# Patient Record
Sex: Male | Born: 1940 | Race: Black or African American | Hispanic: No | Marital: Married | Smoking: Former smoker
Health system: Southern US, Community
[De-identification: ages and names within clinical notes are randomized; demographics above are authoritative.]

## PROBLEM LIST (undated history)

## (undated) DIAGNOSIS — M4850XA Collapsed vertebra, not elsewhere classified, site unspecified, initial encounter for fracture: Secondary | ICD-10-CM

## (undated) DIAGNOSIS — E785 Hyperlipidemia, unspecified: Secondary | ICD-10-CM

## (undated) DIAGNOSIS — I10 Essential (primary) hypertension: Secondary | ICD-10-CM

## (undated) HISTORY — PX: VASCULAR SURGERY: SHX849

## (undated) HISTORY — PX: HERNIA REPAIR: SHX51

## (undated) HISTORY — PX: CATARACT EXTRACTION: SUR2

---

## 2013-11-10 DIAGNOSIS — N3289 Other specified disorders of bladder: Secondary | ICD-10-CM | POA: Insufficient documentation

## 2016-08-17 DIAGNOSIS — M4850XA Collapsed vertebra, not elsewhere classified, site unspecified, initial encounter for fracture: Secondary | ICD-10-CM

## 2016-08-17 HISTORY — DX: Collapsed vertebra, not elsewhere classified, site unspecified, initial encounter for fracture: M48.50XA

## 2016-09-03 DIAGNOSIS — M4856XA Collapsed vertebra, not elsewhere classified, lumbar region, initial encounter for fracture: Secondary | ICD-10-CM | POA: Insufficient documentation

## 2016-09-15 ENCOUNTER — Inpatient Hospital Stay (HOSPITAL_COMMUNITY)
Admission: AD | Admit: 2016-09-15 | Discharge: 2016-10-02 | DRG: 477 | Disposition: A | Discharge status: Hospice | Payer: Medicare Other | Source: Ambulatory Visit | Attending: Neurosurgery | Admitting: Neurosurgery

## 2016-09-15 ENCOUNTER — Other Ambulatory Visit: Payer: Self-pay | Admitting: Neurosurgery

## 2016-09-15 ENCOUNTER — Encounter (HOSPITAL_COMMUNITY): Payer: Self-pay | Admitting: General Practice

## 2016-09-15 ENCOUNTER — Inpatient Hospital Stay (HOSPITAL_COMMUNITY): Payer: Medicare Other

## 2016-09-15 DIAGNOSIS — G911 Obstructive hydrocephalus: Secondary | ICD-10-CM | POA: Diagnosis not present

## 2016-09-15 DIAGNOSIS — Z515 Encounter for palliative care: Secondary | ICD-10-CM | POA: Diagnosis not present

## 2016-09-15 DIAGNOSIS — D496 Neoplasm of unspecified behavior of brain: Secondary | ICD-10-CM

## 2016-09-15 DIAGNOSIS — Z8551 Personal history of malignant neoplasm of bladder: Secondary | ICD-10-CM

## 2016-09-15 DIAGNOSIS — R410 Disorientation, unspecified: Secondary | ICD-10-CM | POA: Diagnosis not present

## 2016-09-15 DIAGNOSIS — C3491 Malignant neoplasm of unspecified part of right bronchus or lung: Secondary | ICD-10-CM | POA: Diagnosis not present

## 2016-09-15 DIAGNOSIS — Z87891 Personal history of nicotine dependence: Secondary | ICD-10-CM

## 2016-09-15 DIAGNOSIS — Z9889 Other specified postprocedural states: Secondary | ICD-10-CM

## 2016-09-15 DIAGNOSIS — Z993 Dependence on wheelchair: Secondary | ICD-10-CM | POA: Diagnosis not present

## 2016-09-15 DIAGNOSIS — G939 Disorder of brain, unspecified: Secondary | ICD-10-CM

## 2016-09-15 DIAGNOSIS — N182 Chronic kidney disease, stage 2 (mild): Secondary | ICD-10-CM | POA: Diagnosis not present

## 2016-09-15 DIAGNOSIS — M4850XA Collapsed vertebra, not elsewhere classified, site unspecified, initial encounter for fracture: Secondary | ICD-10-CM

## 2016-09-15 DIAGNOSIS — E43 Unspecified severe protein-calorie malnutrition: Secondary | ICD-10-CM | POA: Diagnosis present

## 2016-09-15 DIAGNOSIS — C3490 Malignant neoplasm of unspecified part of unspecified bronchus or lung: Secondary | ICD-10-CM | POA: Diagnosis not present

## 2016-09-15 DIAGNOSIS — N189 Chronic kidney disease, unspecified: Secondary | ICD-10-CM | POA: Insufficient documentation

## 2016-09-15 DIAGNOSIS — I739 Peripheral vascular disease, unspecified: Secondary | ICD-10-CM | POA: Diagnosis present

## 2016-09-15 DIAGNOSIS — R911 Solitary pulmonary nodule: Secondary | ICD-10-CM | POA: Diagnosis not present

## 2016-09-15 DIAGNOSIS — G936 Cerebral edema: Secondary | ICD-10-CM | POA: Diagnosis not present

## 2016-09-15 DIAGNOSIS — E785 Hyperlipidemia, unspecified: Secondary | ICD-10-CM | POA: Diagnosis present

## 2016-09-15 DIAGNOSIS — Z419 Encounter for procedure for purposes other than remedying health state, unspecified: Secondary | ICD-10-CM

## 2016-09-15 DIAGNOSIS — T380X5A Adverse effect of glucocorticoids and synthetic analogues, initial encounter: Secondary | ICD-10-CM | POA: Diagnosis present

## 2016-09-15 DIAGNOSIS — F0390 Unspecified dementia without behavioral disturbance: Secondary | ICD-10-CM | POA: Diagnosis present

## 2016-09-15 DIAGNOSIS — M8458XA Pathological fracture in neoplastic disease, other specified site, initial encounter for fracture: Principal | ICD-10-CM | POA: Diagnosis present

## 2016-09-15 DIAGNOSIS — M48061 Spinal stenosis, lumbar region without neurogenic claudication: Secondary | ICD-10-CM | POA: Diagnosis present

## 2016-09-15 DIAGNOSIS — M4850XS Collapsed vertebra, not elsewhere classified, site unspecified, sequela of fracture: Secondary | ICD-10-CM

## 2016-09-15 DIAGNOSIS — Z66 Do not resuscitate: Secondary | ICD-10-CM | POA: Diagnosis not present

## 2016-09-15 DIAGNOSIS — Z01810 Encounter for preprocedural cardiovascular examination: Secondary | ICD-10-CM | POA: Diagnosis not present

## 2016-09-15 DIAGNOSIS — C7931 Secondary malignant neoplasm of brain: Secondary | ICD-10-CM | POA: Diagnosis present

## 2016-09-15 DIAGNOSIS — E784 Other hyperlipidemia: Secondary | ICD-10-CM

## 2016-09-15 DIAGNOSIS — M5416 Radiculopathy, lumbar region: Secondary | ICD-10-CM | POA: Diagnosis present

## 2016-09-15 DIAGNOSIS — Y92230 Patient room in hospital as the place of occurrence of the external cause: Secondary | ICD-10-CM | POA: Diagnosis not present

## 2016-09-15 DIAGNOSIS — I10 Essential (primary) hypertension: Secondary | ICD-10-CM | POA: Diagnosis not present

## 2016-09-15 DIAGNOSIS — C787 Secondary malignant neoplasm of liver and intrahepatic bile duct: Secondary | ICD-10-CM | POA: Diagnosis not present

## 2016-09-15 DIAGNOSIS — N184 Chronic kidney disease, stage 4 (severe): Secondary | ICD-10-CM | POA: Diagnosis present

## 2016-09-15 DIAGNOSIS — I129 Hypertensive chronic kidney disease with stage 1 through stage 4 chronic kidney disease, or unspecified chronic kidney disease: Secondary | ICD-10-CM | POA: Diagnosis present

## 2016-09-15 DIAGNOSIS — R634 Abnormal weight loss: Secondary | ICD-10-CM

## 2016-09-15 DIAGNOSIS — M4850XD Collapsed vertebra, not elsewhere classified, site unspecified, subsequent encounter for fracture with routine healing: Secondary | ICD-10-CM | POA: Diagnosis not present

## 2016-09-15 DIAGNOSIS — Y836 Removal of other organ (partial) (total) as the cause of abnormal reaction of the patient, or of later complication, without mention of misadventure at the time of the procedure: Secondary | ICD-10-CM | POA: Diagnosis not present

## 2016-09-15 DIAGNOSIS — G9761 Postprocedural hematoma of a nervous system organ or structure following a nervous system procedure: Secondary | ICD-10-CM | POA: Diagnosis not present

## 2016-09-15 DIAGNOSIS — M4857XS Collapsed vertebra, not elsewhere classified, lumbosacral region, sequela of fracture: Secondary | ICD-10-CM | POA: Diagnosis not present

## 2016-09-15 DIAGNOSIS — N183 Chronic kidney disease, stage 3 (moderate): Secondary | ICD-10-CM | POA: Diagnosis not present

## 2016-09-15 DIAGNOSIS — R627 Adult failure to thrive: Secondary | ICD-10-CM

## 2016-09-15 DIAGNOSIS — G131 Other systemic atrophy primarily affecting central nervous system in neoplastic disease: Secondary | ICD-10-CM | POA: Diagnosis not present

## 2016-09-15 DIAGNOSIS — R739 Hyperglycemia, unspecified: Secondary | ICD-10-CM | POA: Diagnosis present

## 2016-09-15 DIAGNOSIS — M4856XA Collapsed vertebra, not elsewhere classified, lumbar region, initial encounter for fracture: Secondary | ICD-10-CM | POA: Diagnosis present

## 2016-09-15 DIAGNOSIS — G9341 Metabolic encephalopathy: Secondary | ICD-10-CM | POA: Diagnosis not present

## 2016-09-15 DIAGNOSIS — Z7189 Other specified counseling: Secondary | ICD-10-CM | POA: Diagnosis not present

## 2016-09-15 DIAGNOSIS — Y92019 Unspecified place in single-family (private) house as the place of occurrence of the external cause: Secondary | ICD-10-CM | POA: Diagnosis not present

## 2016-09-15 DIAGNOSIS — M899 Disorder of bone, unspecified: Secondary | ICD-10-CM | POA: Diagnosis not present

## 2016-09-15 DIAGNOSIS — C7951 Secondary malignant neoplasm of bone: Secondary | ICD-10-CM | POA: Diagnosis present

## 2016-09-15 HISTORY — DX: Hyperlipidemia, unspecified: E78.5

## 2016-09-15 HISTORY — DX: Collapsed vertebra, not elsewhere classified, site unspecified, initial encounter for fracture: M48.50XA

## 2016-09-15 HISTORY — DX: Essential (primary) hypertension: I10

## 2016-09-15 LAB — COMPREHENSIVE METABOLIC PANEL
ALK PHOS: 35 U/L — AB (ref 38–126)
ALT: 16 U/L — AB (ref 17–63)
AST: 26 U/L (ref 15–41)
Albumin: 2.9 g/dL — ABNORMAL LOW (ref 3.5–5.0)
Anion gap: 8 (ref 5–15)
BILIRUBIN TOTAL: 0.6 mg/dL (ref 0.3–1.2)
BUN: 17 mg/dL (ref 6–20)
CALCIUM: 9.2 mg/dL (ref 8.9–10.3)
CO2: 24 mmol/L (ref 22–32)
CREATININE: 1.57 mg/dL — AB (ref 0.61–1.24)
Chloride: 105 mmol/L (ref 101–111)
GFR, EST AFRICAN AMERICAN: 47 mL/min — AB (ref >60)
GFR, EST NON AFRICAN AMERICAN: 41 mL/min — AB (ref >60)
Glucose, Bld: 114 mg/dL — ABNORMAL HIGH (ref 65–99)
Potassium: 4 mmol/L (ref 3.5–5.1)
Sodium: 137 mmol/L (ref 135–145)
TOTAL PROTEIN: 6.4 g/dL — AB (ref 6.5–8.1)

## 2016-09-15 LAB — CBC
HCT: 36.4 % — ABNORMAL LOW (ref 39.0–52.0)
Hemoglobin: 11.8 g/dL — ABNORMAL LOW (ref 13.0–17.0)
MCH: 26.8 pg (ref 26.0–34.0)
MCHC: 32.4 g/dL (ref 30.0–36.0)
MCV: 82.7 fL (ref 78.0–100.0)
PLATELETS: 134 10*3/uL — AB (ref 150–400)
RBC: 4.4 MIL/uL (ref 4.22–5.81)
RDW: 15.1 % (ref 11.5–15.5)
WBC: 4 10*3/uL (ref 4.0–10.5)

## 2016-09-15 LAB — GLUCOSE, CAPILLARY
GLUCOSE-CAPILLARY: 94 mg/dL (ref 65–99)
Glucose-Capillary: 106 mg/dL — ABNORMAL HIGH (ref 65–99)

## 2016-09-15 MED ORDER — AMLODIPINE BESYLATE 10 MG PO TABS
10.0000 mg | ORAL_TABLET | Freq: Every day | ORAL | Status: DC
  Administered 2016-09-15 – 2016-09-28 (×14): 10 mg via ORAL
  Filled 2016-09-15 (×14): qty 1

## 2016-09-15 MED ORDER — CARVEDILOL 12.5 MG PO TABS
25.0000 mg | ORAL_TABLET | Freq: Two times a day (BID) | ORAL | Status: DC
  Administered 2016-09-15 – 2016-09-28 (×26): 25 mg via ORAL
  Filled 2016-09-15 (×26): qty 1

## 2016-09-15 MED ORDER — HYDROCODONE-ACETAMINOPHEN 5-325 MG PO TABS
1.0000 | ORAL_TABLET | ORAL | Status: DC | PRN
  Filled 2016-09-15: qty 2
  Filled 2016-09-15: qty 1

## 2016-09-15 MED ORDER — EZETIMIBE 10 MG PO TABS
10.0000 mg | ORAL_TABLET | Freq: Every day | ORAL | Status: DC
  Administered 2016-09-15 – 2016-09-28 (×12): 10 mg via ORAL
  Filled 2016-09-15 (×12): qty 1

## 2016-09-15 MED ORDER — LACTATED RINGERS IV SOLN
INTRAVENOUS | Status: DC
  Administered 2016-09-15 – 2016-09-16 (×3): via INTRAVENOUS

## 2016-09-15 MED ORDER — SIMVASTATIN 20 MG PO TABS
20.0000 mg | ORAL_TABLET | Freq: Every day | ORAL | Status: DC
  Administered 2016-09-15 – 2016-09-28 (×13): 20 mg via ORAL
  Filled 2016-09-15 (×12): qty 1

## 2016-09-15 MED ORDER — MORPHINE SULFATE (PF) 4 MG/ML IV SOLN
1.0000 mg | INTRAVENOUS | Status: DC | PRN
  Administered 2016-09-15 – 2016-09-30 (×11): 2 mg via INTRAVENOUS
  Filled 2016-09-15 (×11): qty 1

## 2016-09-15 MED ORDER — HEPARIN SODIUM (PORCINE) 5000 UNIT/ML IJ SOLN
5000.0000 [IU] | Freq: Three times a day (TID) | INTRAMUSCULAR | Status: DC
  Administered 2016-09-15 – 2016-09-17 (×3): 5000 [IU] via SUBCUTANEOUS
  Filled 2016-09-15 (×3): qty 1

## 2016-09-15 MED ORDER — HYDRALAZINE HCL 20 MG/ML IJ SOLN
5.0000 mg | INTRAMUSCULAR | Status: DC | PRN
  Administered 2016-09-29 (×2): 10 mg via INTRAVENOUS
  Filled 2016-09-15 (×3): qty 1

## 2016-09-15 NOTE — Consult Note (Signed)
Medical Consultation   Zachary Mccarthy  LDJ:[id number]  DOB: [birth year]  DOA: 09/15/2016  PCP: [name], MD    Requesting physician: Dr. Kathyrn Sheriff Neurosurgery  Reason for consultation: To help in the management of patient's primary medical issues     History of Present Illness: Zachary Mccarthy is an 76 y.o. male with a history of hypertension, hyperlipidemia, CKD  renal mass, brought via direct admission from the office of Dr. Kathyrn Sheriff, Neurosurgery, after patient presented with severe right sided leg pain, progressive, present over the last 5 months, radiating to right thigh and to the right knee, limiting his ADLS. A prior MRI taken on 4/12 as outpatient showed pathologic L4 compression fracture with greater than 50 % loss of height. He is to undergo R L4-5 decompression with biopsy of the L4 VB tomorrow afternoon. [facility] team has been requested to help in the patient's medical management. He denies any bowel or bladder incontinence or retention, No Left lower extremity symptoms., No history of cancer. Has lost about 20 lbs over the last 5 months, unintentional. Denies fevers, chills, night sweats.  Denies any respiratory complaints. Denies any chest pain or palpitations. Denies lower extremity swelling. Denies nausea, heartburn or  abdominal pain.  Denies abnormal skin rashes Denies any bleeding issues such as epistaxis, hematemesis, hematuria or hematochezia. Family  reports confused when taking Norco for which he has not been using narcotic pain meds. Denies any headaches, vision changes or seizure activity, falls or syncope. Family is not aware of patient having been diagnosed with dementia.     Review of Systems:  As per HPI otherwise 10 point review of systems negative.     Past Medical History: Past Medical History:  Diagnosis Date  . Compression fracture of spine ([place]) 08/2016  . Hyperlipidemia   . Hypertension     Past Surgical History: Past  Surgical History:  Procedure Laterality Date  . CATARACT EXTRACTION    . HERNIA REPAIR    . VASCULAR SURGERY       Allergies:  No Known Allergies   Social History: Social History   Social History  . Marital status: Married    Spouse name: N/A  . Number of children: N/A  . Years of education: N/A   Occupational History  . Not on file.   Social History Main Topics  . Smoking status: Former Research scientist (life sciences)  . Smokeless tobacco: Never Used     Comment: quit smoking in the 80's   . Alcohol use No  . Drug use: No  . Sexual activity: Not on file   Other Topics Concern  . Not on file   Social History Narrative  . No narrative on file       Family History: Family History  Problem Relation Age of Onset  . Family history unknown: Yes    Family history reviewed and not pertinent    Physical Exam: Vitals:   09/15/16 1306 09/15/16 1530 09/15/16 1725  BP: (!) 176/96 (!) 152/76 (!) 153/75  Pulse: 83  79  Resp: '18 18 18  '$ Temp: 97.9 F (36.6 C) 97.8 F (36.6 C) 98.6 F (37 C)  TempSrc: Oral Oral Oral  SpO2: 99% 99% 97%  Weight: 63.2 kg (139 lb 6.4 oz)    Height: '5\' 9"'$  (1.753 m)      Constitutional: Appears calm,  alert and awake, conversant. Knows place and date, person at the  time of exam. NAD Eyes: PERLA, EOMI, irises appear normal, anicteric sclera,  ENMT: external ears and nose appear normal, normal hearing or hard of hearing.  Lips appears normal, oropharynx mucosa, tongue, posterior pharynx appear normal  Neck: neck appears normal, no masses, normal ROM, no thyromegaly, no JVD  CVS RRR with some PVC  no murmur rubs or gallops, no LE edema, normal pedal pulses  Respiratory: clear to auscultation bilaterally, no wheezing, rales or rhonchi. Respiratory effort normal. No accessory muscle use.  Abdomen: soft nontender, nondistended, normal bowel sounds, no hepatosplenomegaly, no hernias  Musculoskeletal: no cyanosis, clubbing or edema noted bilaterally.        Neuro:Normal sensation, decreased 3/5 strength on R  Psych:   stable mood and affect, mental status appears normal at this time Skin: no rashes or lesions or ulcers, no induration or nodules   Data reviewed:  I have personally reviewed following labs and imaging studies Labs:  CBC:  Recent Labs Lab 09/15/16 1507  WBC 4.0  HGB 11.8*  HCT 36.4*  MCV 82.7  PLT 134*    Basic Metabolic Panel:  Recent Labs Lab 09/15/16 1507  NA 137  K 4.0  CL 105  CO2 24  GLUCOSE 114*  BUN 17  CREATININE 1.57*  CALCIUM 9.2   GFR Estimated Creatinine Clearance: 35.2 mL/min (A) (by C-G formula based on SCr of 1.57 mg/dL (H)). Liver Function Tests:  Recent Labs Lab 09/15/16 1507  AST 26  ALT 16*  ALKPHOS 35*  BILITOT 0.6  PROT 6.4*  ALBUMIN 2.9*   No results for input(s): LIPASE, AMYLASE in the last 168 hours. No results for input(s): AMMONIA in the last 168 hours. Coagulation profile No results for input(s): INR, PROTIME in the last 168 hours.  Cardiac Enzymes: No results for input(s): CKTOTAL, CKMB, CKMBINDEX, TROPONINI in the last 168 hours. BNP: Invalid input(s): POCBNP CBG:  Recent Labs Lab 09/15/16 1747  GLUCAP 94   D-Dimer No results for input(s): DDIMER in the last 72 hours. Hgb A1c No results for input(s): HGBA1C in the last 72 hours. Lipid Profile No results for input(s): CHOL, HDL, LDLCALC, TRIG, CHOLHDL, LDLDIRECT in the last 72 hours. Thyroid function studies No results for input(s): TSH, T4TOTAL, T3FREE, THYROIDAB in the last 72 hours.  Invalid input(s): FREET3 Anemia work up No results for input(s): VITAMINB12, FOLATE, FERRITIN, TIBC, IRON, RETICCTPCT in the last 72 hours. Urinalysis No results found for: COLORURINE, APPEARANCEUR, LABSPEC, [place], GLUCOSEU, HGBUR, BILIRUBINUR, KETONESUR, PROTEINUR, UROBILINOGEN, NITRITE, LEUKOCYTESUR   Sepsis Labs Invalid input(s): PROCALCITONIN,  WBC,  LACTICIDVEN Microbiology No results found for this or any  previous visit (from the past 240 hour(s)).     Inpatient Medications:   Scheduled Meds: . amLODipine  10 mg Oral Daily  . carvedilol  25 mg Oral BID WC  . ezetimibe  10 mg Oral Daily  . heparin  5,000 Units Subcutaneous Q8H  . simvastatin  20 mg Oral q1800   Continuous Infusions: . lactated ringers       Radiological Exams on Admission: No results found.  Impression/Recommendations Active Problems:   Pathologic compression fracture of spine (HCC)   Essential hypertension   Hyperlipidemia   Chronic kidney disease (CKD), stage IV (severe) (HCC)   Pathologic compression Fracture, in a patient with a history of renal mass status post TURBT for high-grade noninvasive bladder cancer diagnosed in 2017 with negative cystoscopy on surveillance at [facility]. MRI taken on 4/12 as outpatient showed pathologic L4 compression fracture with  greater than 50 % loss of height. He is to undergo R L4-5 decompression with biopsy of the L4 VB tomorrow afternoon. Anticipating surgery as per Neurosurgery tomorrow afternoon NPO after midnight SCDs Lactate ringer 50 cc/h after midnight  Pain control as per admitting physician PT/OT consult Preop labs and CXR and EKG  Of note, patient may need Oncology consult while in hospital   Hypertension BP176/96   Pulse 83 Continue home anti-hypertensive medications    Hyperlipidemia Continue home statins   Chronic kidney disease stage  III  In the setting of renal cancer followed by Urology  baseline creatinine    Current Cr 1.57 baseline Lab Results  Component Value Date   CREATININE 1.57 (H) 09/15/2016  Repeat CMET in am  Decreased mentation, per family report, medicine induced such as narcotics, versus dementia, cannot rule out metastatic disease in view of pathologic compression fracture and history of renal cancer. Afebrile, labs unrevealing.  Consider holding Norco If dementia is suspected, patient will need outpatient follow up for its  treatment Consider CT head in a.m  if confusion is more evident r/o etiology including malignancy.   Hyperglycemia in the setting of recent steroids , no history of DM BS 115 CBG q AC and q HS A1C     Thank you for this consultation.  Our [facility] hospitalist team will follow the patient with you.      [facility] E PA-C [facility] 09/15/2016, 6:02 PM

## 2016-09-15 NOTE — H&P (Signed)
CC:  Right leg pain Lethargy Weight loss  HPI: Zachary Mccarthy is a 76 year old man I saw for initial consultation earlier today in the office.  He comes in today with a primary complaint of right-sided leg pain.  Symptoms started back in December about 5 months ago.  There is no particular inciting event, and he and his family deny any falls or accidents.  Initially, he had fairly severe back pain.  Over time, the back pain improved, but he began developing progressive right-sided leg pain.  He says the pain starts around the region of the right hip, with radiation down the back inside of his right thigh, to about the knee.  At this point, his family say that the pain prevents him from getting up, and he has been in a wheelchair for the last month or so.  His family states that they have a hard time getting him out of the house, and required the assistance of neighbors to get him into the car to bring him to the office today.  He has not noted any loss of bowel or bladder control.  He does not have any left leg symptoms.  There is no known history of cancer.  Upon further questioning, they do report several month history of generalized malaise, lethargy, and about 20 pounds of weight loss.  They state that he has not been eating well.    PMH: HTN  PSH: No past surgical history on file.  SH: Social History  Substance Use Topics  . Smoking status: Not on file  . Smokeless tobacco: Not on file  . Alcohol use Not on file  Non-smoker  MEDS: amlodipine 10 mg tablet take 1 tablet by oral route  every day  carvedilol 25 mg tablet take 1 tablet by oral route 2 times every day  clopidogrel 75 mg tablet take 1 tablet by oral route  every day  ezetimibe 10 mg-simvastatin 20 mg tablet take 1 tablet by oral route  every day    ALLERGY: NKDA  ROS: ROS  NEUROLOGIC EXAM: Awake, alert, oriented Memory and concentration grossly intact Speech fluent, appropriate CN grossly intact Motor exam: Upper  Extremities Deltoid Bicep Tricep Grip  Right 5/5 5/5 5/5 5/5  Left 5/5 5/5 5/5 5/5   Lower Extremity IP Quad PF DF EHL  Right 5/5 5/5 5/5 5/5 5/5  Left 5/5 5/5 5/5 5/5 5/5   Sensation grossly intact to LT  [facility]: MRI of the lumbar spine dated 08/28/2016 was reviewed.  This demonstrates primary pathology at L4, where there is compression fracture, with greater than 50 percent loss of height.  There does not appear to be any significant amount of retropulsed bone.  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  There does not appear to be any significant central or foraminal stenosis at the other levels.  IMPRESSION: 75 year old man with right-sided leg pain likely related to foraminal stenosis from the compression fracture.  Certainly, the concern is that this is a pathologic fracture.  He also does have several constitutional symptoms concerning for an underlying malignancy.  Functionally, he has essentially unable to leave the house, and his family is having a hard time taking care of him.  PLAN: - Right L4-5 decompression with biopsy of L4 vertebral body tomorrow afternoon - Will consult hospitalist service

## 2016-09-15 NOTE — Progress Notes (Signed)
Pt received direct admit from Md office. Pt alert, verbal with no noted distress. Pt with memory impairment his wife and daughter at bedside reported that after taking pain medication two weeks ago, he has been confused. He complaints of pain to right hip radiating to right knee. He rates pain 8/10. Wife and daughter were able to personal information. Pt oriented to room. Safety measures in place. Call bell within reach. No orders at this time. Will contact Md for further instructions.

## 2016-09-15 NOTE — Progress Notes (Signed)
MD office called and RN at the office notified of pt's arrival to the unit and pt needing orders from MD. Delia Heady RN

## 2016-09-15 NOTE — Care Management Note (Signed)
Case Management Note  Patient Details  Name: Zachary Mccarthy MRN: [id number] Date of Birth: 02/28/1941  Subjective/Objective:   Pt admitted with leg pain. He is from home with his spouse. Pt has been wheelchair bound for about a month.                 Action/Plan: Plan is for L4 vertebral biopsy tomorrow. CM following for d/c needs, physician orders.   Expected Discharge Date:                  Expected Discharge Plan:     In-House Referral:     Discharge planning Services     Post Acute Care Choice:    Choice offered to:     DME Arranged:    DME Agency:     HH Arranged:    HH Agency:     Status of Service:  In process, will continue to follow  If discussed at Long Length of Stay Meetings, dates discussed:    Additional Comments:  Pollie Friar, RN 09/15/2016, 3:17 PM

## 2016-09-16 ENCOUNTER — Inpatient Hospital Stay: Admit: 2016-09-16 | Payer: Medicare Other | Admitting: Neurosurgery

## 2016-09-16 ENCOUNTER — Inpatient Hospital Stay (HOSPITAL_COMMUNITY): Payer: Medicare Other | Admitting: Certified Registered Nurse Anesthetist

## 2016-09-16 ENCOUNTER — Inpatient Hospital Stay (HOSPITAL_COMMUNITY): Payer: Medicare Other

## 2016-09-16 ENCOUNTER — Encounter (HOSPITAL_COMMUNITY): Payer: Self-pay | Admitting: Certified Registered Nurse Anesthetist

## 2016-09-16 ENCOUNTER — Encounter (HOSPITAL_COMMUNITY)
Admission: AD | Disposition: A | Discharge status: Hospice | Payer: Self-pay | Source: Ambulatory Visit | Attending: Neurosurgery

## 2016-09-16 DIAGNOSIS — R911 Solitary pulmonary nodule: Secondary | ICD-10-CM

## 2016-09-16 DIAGNOSIS — N183 Chronic kidney disease, stage 3 (moderate): Secondary | ICD-10-CM

## 2016-09-16 HISTORY — PX: LUMBAR LAMINECTOMY/DECOMPRESSION MICRODISCECTOMY: SHX5026

## 2016-09-16 LAB — CBC
HCT: 37.7 % — ABNORMAL LOW (ref 39.0–52.0)
Hemoglobin: 12.1 g/dL — ABNORMAL LOW (ref 13.0–17.0)
MCH: 26.3 pg (ref 26.0–34.0)
MCHC: 32.1 g/dL (ref 30.0–36.0)
MCV: 82 fL (ref 78.0–100.0)
PLATELETS: 147 10*3/uL — AB (ref 150–400)
RBC: 4.6 MIL/uL (ref 4.22–5.81)
RDW: 15 % (ref 11.5–15.5)
WBC: 4.2 10*3/uL (ref 4.0–10.5)

## 2016-09-16 LAB — COMPREHENSIVE METABOLIC PANEL
ALT: 15 U/L — ABNORMAL LOW (ref 17–63)
AST: 26 U/L (ref 15–41)
Albumin: 2.6 g/dL — ABNORMAL LOW (ref 3.5–5.0)
Alkaline Phosphatase: 38 U/L (ref 38–126)
Anion gap: 8 (ref 5–15)
BUN: 17 mg/dL (ref 6–20)
CHLORIDE: 105 mmol/L (ref 101–111)
CO2: 24 mmol/L (ref 22–32)
Calcium: 9.1 mg/dL (ref 8.9–10.3)
Creatinine, Ser: 1.44 mg/dL — ABNORMAL HIGH (ref 0.61–1.24)
GFR calc Af Amer: 53 mL/min — ABNORMAL LOW (ref >60)
GFR, EST NON AFRICAN AMERICAN: 45 mL/min — AB (ref >60)
Glucose, Bld: 97 mg/dL (ref 65–99)
POTASSIUM: 3.6 mmol/L (ref 3.5–5.1)
SODIUM: 137 mmol/L (ref 135–145)
Total Bilirubin: 0.8 mg/dL (ref 0.3–1.2)
Total Protein: 6.1 g/dL — ABNORMAL LOW (ref 6.5–8.1)

## 2016-09-16 LAB — GLUCOSE, CAPILLARY
Glucose-Capillary: 87 mg/dL (ref 65–99)
Glucose-Capillary: 94 mg/dL (ref 65–99)

## 2016-09-16 LAB — SURGICAL PCR SCREEN
MRSA, PCR: NEGATIVE
STAPHYLOCOCCUS AUREUS: NEGATIVE

## 2016-09-16 LAB — HEMOGLOBIN A1C
HEMOGLOBIN A1C: 6.5 % — AB (ref 4.8–5.6)
Mean Plasma Glucose: 140 mg/dL

## 2016-09-16 LAB — PROTIME-INR
INR: 1.1
Prothrombin Time: 14.3 seconds (ref 11.4–15.2)

## 2016-09-16 LAB — APTT: APTT: 31 s (ref 24–36)

## 2016-09-16 SURGERY — LUMBAR LAMINECTOMY/DECOMPRESSION MICRODISCECTOMY 1 LEVEL
Anesthesia: General | Laterality: Right

## 2016-09-16 MED ORDER — THROMBIN 5000 UNITS EX SOLR
OROMUCOSAL | Status: DC | PRN
  Administered 2016-09-16: 17:00:00 via TOPICAL

## 2016-09-16 MED ORDER — CHLORHEXIDINE GLUCONATE CLOTH 2 % EX PADS
6.0000 | MEDICATED_PAD | Freq: Once | CUTANEOUS | Status: AC
  Administered 2016-09-16: 6 via TOPICAL

## 2016-09-16 MED ORDER — SENNA 8.6 MG PO TABS
1.0000 | ORAL_TABLET | Freq: Two times a day (BID) | ORAL | Status: DC
  Administered 2016-09-16 – 2016-09-28 (×16): 8.6 mg via ORAL
  Filled 2016-09-16 (×22): qty 1

## 2016-09-16 MED ORDER — CEFAZOLIN SODIUM-DEXTROSE 2-3 GM-% IV SOLR
INTRAVENOUS | Status: DC | PRN
  Administered 2016-09-16: 2 g via INTRAVENOUS

## 2016-09-16 MED ORDER — KETOROLAC TROMETHAMINE 15 MG/ML IJ SOLN
7.5000 mg | Freq: Four times a day (QID) | INTRAMUSCULAR | Status: AC
  Administered 2016-09-16 – 2016-09-17 (×4): 7.5 mg via INTRAVENOUS
  Filled 2016-09-16 (×4): qty 1

## 2016-09-16 MED ORDER — HYDROMORPHONE HCL 1 MG/ML IJ SOLN
0.2500 mg | INTRAMUSCULAR | Status: DC | PRN
  Administered 2016-09-16: 0.25 mg via INTRAVENOUS

## 2016-09-16 MED ORDER — DOCUSATE SODIUM 100 MG PO CAPS
100.0000 mg | ORAL_CAPSULE | Freq: Two times a day (BID) | ORAL | Status: DC
  Administered 2016-09-16 – 2016-09-27 (×15): 100 mg via ORAL
  Filled 2016-09-16 (×22): qty 1

## 2016-09-16 MED ORDER — METHYLPREDNISOLONE ACETATE 80 MG/ML IJ SUSP
INTRAMUSCULAR | Status: DC | PRN
  Administered 2016-09-16: 80 mg

## 2016-09-16 MED ORDER — BUPIVACAINE HCL (PF) 0.5 % IJ SOLN
INTRAMUSCULAR | Status: DC | PRN
  Administered 2016-09-16: 4 mL

## 2016-09-16 MED ORDER — MEPERIDINE HCL 25 MG/ML IJ SOLN: 6.2500 mg | INTRAMUSCULAR | Status: DC | PRN

## 2016-09-16 MED ORDER — HEMOSTATIC AGENTS (NO CHARGE) OPTIME
TOPICAL | Status: DC | PRN
  Administered 2016-09-16: 1 via TOPICAL

## 2016-09-16 MED ORDER — METHOCARBAMOL 1000 MG/10ML IJ SOLN
500.0000 mg | Freq: Four times a day (QID) | INTRAVENOUS | Status: DC | PRN
  Filled 2016-09-16: qty 5

## 2016-09-16 MED ORDER — FLEET ENEMA 7-19 GM/118ML RE ENEM
1.0000 | ENEMA | Freq: Once | RECTAL | Status: DC | PRN
Start: 2016-09-16 — End: 2016-10-02

## 2016-09-16 MED ORDER — MENTHOL 3 MG MT LOZG: 1.0000 | LOZENGE | OROMUCOSAL | Status: DC | PRN

## 2016-09-16 MED ORDER — ONDANSETRON HCL 4 MG/2ML IJ SOLN
INTRAMUSCULAR | Status: DC | PRN
  Administered 2016-09-16: 4 mg via INTRAVENOUS

## 2016-09-16 MED ORDER — SODIUM CHLORIDE 0.9% FLUSH: 3.0000 mL | INTRAVENOUS | Status: DC | PRN

## 2016-09-16 MED ORDER — SUGAMMADEX SODIUM 200 MG/2ML IV SOLN
INTRAVENOUS | Status: DC | PRN
Start: 2016-09-16 — End: 2016-09-16
  Administered 2016-09-16: 150 mg via INTRAVENOUS

## 2016-09-16 MED ORDER — ROCURONIUM BROMIDE 100 MG/10ML IV SOLN
INTRAVENOUS | Status: DC | PRN
  Administered 2016-09-16: 10 mg via INTRAVENOUS
  Administered 2016-09-16: 50 mg via INTRAVENOUS
  Administered 2016-09-16: 5 mg via INTRAVENOUS
  Administered 2016-09-16: 10 mg via INTRAVENOUS

## 2016-09-16 MED ORDER — POLYETHYLENE GLYCOL 3350 17 G PO PACK
17.0000 g | PACK | Freq: Every day | ORAL | Status: DC | PRN
Start: 2016-09-16 — End: 2016-09-30

## 2016-09-16 MED ORDER — DEXAMETHASONE SODIUM PHOSPHATE 10 MG/ML IJ SOLN
INTRAMUSCULAR | Status: DC | PRN
  Administered 2016-09-16: 8 mg via INTRAVENOUS

## 2016-09-16 MED ORDER — SODIUM CHLORIDE 0.9% FLUSH
3.0000 mL | Freq: Two times a day (BID) | INTRAVENOUS | Status: DC
  Administered 2016-09-17 – 2016-09-22 (×10): 3 mL via INTRAVENOUS

## 2016-09-16 MED ORDER — LACTATED RINGERS IV SOLN
INTRAVENOUS | Status: DC | PRN
  Administered 2016-09-16: 16:00:00 via INTRAVENOUS

## 2016-09-16 MED ORDER — CEFAZOLIN SODIUM 1 G IJ SOLR
INTRAMUSCULAR | Status: AC
  Filled 2016-09-16: qty 20

## 2016-09-16 MED ORDER — PROMETHAZINE HCL 25 MG/ML IJ SOLN: 6.2500 mg | INTRAMUSCULAR | Status: DC | PRN

## 2016-09-16 MED ORDER — CEFAZOLIN SODIUM-DEXTROSE 2-4 GM/100ML-% IV SOLN
2.0000 g | Freq: Three times a day (TID) | INTRAVENOUS | Status: AC
  Administered 2016-09-17 (×2): 2 g via INTRAVENOUS
  Filled 2016-09-16 (×2): qty 100

## 2016-09-16 MED ORDER — THROMBIN 5000 UNITS EX SOLR
CUTANEOUS | Status: AC
  Filled 2016-09-16: qty 5000

## 2016-09-16 MED ORDER — PHENYLEPHRINE 40 MCG/ML (10ML) SYRINGE FOR IV PUSH (FOR BLOOD PRESSURE SUPPORT)
PREFILLED_SYRINGE | INTRAVENOUS | Status: DC | PRN
  Administered 2016-09-16: 80 ug via INTRAVENOUS
  Administered 2016-09-16: 40 ug via INTRAVENOUS

## 2016-09-16 MED ORDER — SODIUM CHLORIDE 0.9 % IV SOLN
INTRAVENOUS | Status: DC
  Administered 2016-09-16 – 2016-09-18 (×3): via INTRAVENOUS

## 2016-09-16 MED ORDER — PHENOL 1.4 % MT LIQD: 1.0000 | OROMUCOSAL | Status: DC | PRN

## 2016-09-16 MED ORDER — ONDANSETRON HCL 4 MG PO TABS: 4.0000 mg | ORAL_TABLET | Freq: Four times a day (QID) | ORAL | Status: DC | PRN

## 2016-09-16 MED ORDER — HYDROMORPHONE HCL 1 MG/ML IJ SOLN
INTRAMUSCULAR | Status: AC
  Filled 2016-09-16: qty 0.5

## 2016-09-16 MED ORDER — ONDANSETRON HCL 4 MG/2ML IJ SOLN: 4.0000 mg | Freq: Four times a day (QID) | INTRAMUSCULAR | Status: DC | PRN

## 2016-09-16 MED ORDER — 0.9 % SODIUM CHLORIDE (POUR BTL) OPTIME
TOPICAL | Status: DC | PRN
  Administered 2016-09-16: 1000 mL

## 2016-09-16 MED ORDER — METHYLPREDNISOLONE ACETATE 80 MG/ML IJ SUSP
INTRAMUSCULAR | Status: AC
  Filled 2016-09-16: qty 1

## 2016-09-16 MED ORDER — METHOCARBAMOL 500 MG PO TABS
500.0000 mg | ORAL_TABLET | Freq: Four times a day (QID) | ORAL | Status: DC | PRN
  Filled 2016-09-16: qty 1

## 2016-09-16 MED ORDER — PHENYLEPHRINE HCL 10 MG/ML IJ SOLN
INTRAVENOUS | Status: DC | PRN
  Administered 2016-09-16: 50 ug/min via INTRAVENOUS

## 2016-09-16 MED ORDER — EPHEDRINE SULFATE 50 MG/ML IJ SOLN
INTRAMUSCULAR | Status: DC | PRN
  Administered 2016-09-16: 10 mg via INTRAVENOUS

## 2016-09-16 MED ORDER — GABAPENTIN 300 MG PO CAPS
300.0000 mg | ORAL_CAPSULE | Freq: Three times a day (TID) | ORAL | Status: DC
  Administered 2016-09-16 – 2016-09-28 (×35): 300 mg via ORAL
  Filled 2016-09-16 (×36): qty 1

## 2016-09-16 MED ORDER — FENTANYL CITRATE (PF) 250 MCG/5ML IJ SOLN
INTRAMUSCULAR | Status: AC
  Filled 2016-09-16: qty 5

## 2016-09-16 MED ORDER — THROMBIN 5000 UNITS EX SOLR
CUTANEOUS | Status: AC
  Filled 2016-09-16: qty 10000

## 2016-09-16 MED ORDER — FENTANYL CITRATE (PF) 100 MCG/2ML IJ SOLN
INTRAMUSCULAR | Status: DC | PRN
  Administered 2016-09-16: 100 ug via INTRAVENOUS
  Administered 2016-09-16: 50 ug via INTRAVENOUS

## 2016-09-16 MED ORDER — THROMBIN 5000 UNITS EX SOLR
CUTANEOUS | Status: DC | PRN
  Administered 2016-09-16: 10000 [IU] via TOPICAL

## 2016-09-16 MED ORDER — LIDOCAINE-EPINEPHRINE 1 %-1:100000 IJ SOLN
INTRAMUSCULAR | Status: AC
  Filled 2016-09-16: qty 1

## 2016-09-16 MED ORDER — BISACODYL 10 MG RE SUPP
10.0000 mg | Freq: Every day | RECTAL | Status: DC | PRN
  Administered 2016-09-19: 10 mg via RECTAL
  Filled 2016-09-16 (×2): qty 1

## 2016-09-16 MED ORDER — CEFAZOLIN SODIUM-DEXTROSE 2-4 GM/100ML-% IV SOLN
INTRAVENOUS | Status: AC
  Filled 2016-09-16: qty 100

## 2016-09-16 MED ORDER — LIDOCAINE 2% (20 MG/ML) 5 ML SYRINGE
INTRAMUSCULAR | Status: DC | PRN
  Administered 2016-09-16: 100 mg via INTRAVENOUS

## 2016-09-16 MED ORDER — SODIUM CHLORIDE 0.9 % IR SOLN
Status: DC | PRN
  Administered 2016-09-16: 17:00:00

## 2016-09-16 MED ORDER — ALBUMIN HUMAN 5 % IV SOLN
INTRAVENOUS | Status: DC | PRN
  Administered 2016-09-16: 16:00:00 via INTRAVENOUS

## 2016-09-16 MED ORDER — LIDOCAINE-EPINEPHRINE 1 %-1:100000 IJ SOLN
INTRAMUSCULAR | Status: DC | PRN
  Administered 2016-09-16: 4 mL

## 2016-09-16 MED ORDER — PANTOPRAZOLE SODIUM 40 MG IV SOLR
40.0000 mg | Freq: Every day | INTRAVENOUS | Status: DC
  Administered 2016-09-16 – 2016-09-17 (×2): 40 mg via INTRAVENOUS
  Filled 2016-09-16 (×2): qty 40

## 2016-09-16 MED ORDER — PROPOFOL 10 MG/ML IV BOLUS
INTRAVENOUS | Status: DC | PRN
  Administered 2016-09-16: 80 mg via INTRAVENOUS

## 2016-09-16 MED ORDER — SODIUM CHLORIDE 0.9 % IV SOLN
250.0000 mL | INTRAVENOUS | Status: DC
  Administered 2016-09-22: 12:00:00 via INTRAVENOUS

## 2016-09-16 SURGICAL SUPPLY — 62 items
BAG DECANTER FOR FLEXI CONT (MISCELLANEOUS) ×3 IMPLANT
BENZOIN TINCTURE PRP APPL 2/3 (GAUZE/BANDAGES/DRESSINGS) IMPLANT
BLADE CLIPPER SURG (BLADE) IMPLANT
BLADE SURG 11 STRL SS (BLADE) IMPLANT
BUR MATCHSTICK NEURO 3.0 LAGG (BURR) ×3 IMPLANT
CANISTER SUCT 3000ML PPV (MISCELLANEOUS) ×3 IMPLANT
CARTRIDGE OIL MAESTRO DRILL (MISCELLANEOUS) ×1 IMPLANT
CLOSURE WOUND 1/2 X4 (GAUZE/BANDAGES/DRESSINGS)
DECANTER SPIKE VIAL GLASS SM (MISCELLANEOUS) ×3 IMPLANT
DERMABOND ADVANCED (GAUZE/BANDAGES/DRESSINGS) ×2
DERMABOND ADVANCED .7 DNX12 (GAUZE/BANDAGES/DRESSINGS) ×1 IMPLANT
DEVICE BIOPSY BONE KYPHX (INSTRUMENTS) ×3 IMPLANT
DIFFUSER DRILL AIR PNEUMATIC (MISCELLANEOUS) ×3 IMPLANT
DRAPE LAPAROTOMY 100X72X124 (DRAPES) ×3 IMPLANT
DRAPE MICROSCOPE LEICA (MISCELLANEOUS) ×3 IMPLANT
DRAPE POUCH INSTRU U-SHP 10X18 (DRAPES) ×3 IMPLANT
DRAPE SURG 17X23 STRL (DRAPES) ×3 IMPLANT
DRSG OPSITE POSTOP 3X4 (GAUZE/BANDAGES/DRESSINGS) ×3 IMPLANT
DURAPREP 26ML APPLICATOR (WOUND CARE) ×3 IMPLANT
ELECT REM PT RETURN 9FT ADLT (ELECTROSURGICAL) ×3
ELECTRODE REM PT RTRN 9FT ADLT (ELECTROSURGICAL) ×1 IMPLANT
GAUZE SPONGE 4X4 12PLY STRL (GAUZE/BANDAGES/DRESSINGS) IMPLANT
GAUZE SPONGE 4X4 16PLY XRAY LF (GAUZE/BANDAGES/DRESSINGS) IMPLANT
GLOVE BIO SURGEON STRL SZ7 (GLOVE) IMPLANT
GLOVE BIOGEL PI IND STRL 7.0 (GLOVE) IMPLANT
GLOVE BIOGEL PI IND STRL 7.5 (GLOVE) ×2 IMPLANT
GLOVE BIOGEL PI INDICATOR 7.0 (GLOVE)
GLOVE BIOGEL PI INDICATOR 7.5 (GLOVE) ×4
GLOVE ECLIPSE 7.0 STRL STRAW (GLOVE) ×3 IMPLANT
GLOVE EXAM NITRILE LRG STRL (GLOVE) IMPLANT
GLOVE EXAM NITRILE XL STR (GLOVE) IMPLANT
GLOVE EXAM NITRILE XS STR PU (GLOVE) IMPLANT
GOWN STRL REUS W/ TWL LRG LVL3 (GOWN DISPOSABLE) ×2 IMPLANT
GOWN STRL REUS W/ TWL XL LVL3 (GOWN DISPOSABLE) ×1 IMPLANT
GOWN STRL REUS W/TWL 2XL LVL3 (GOWN DISPOSABLE) IMPLANT
GOWN STRL REUS W/TWL LRG LVL3 (GOWN DISPOSABLE) ×4
GOWN STRL REUS W/TWL XL LVL3 (GOWN DISPOSABLE) ×2
HEMOSTAT POWDER KIT SURGIFOAM (HEMOSTASIS) ×6 IMPLANT
KIT BASIN OR (CUSTOM PROCEDURE TRAY) ×3 IMPLANT
KIT BIOPSY BONE KYPHX (KITS) ×3 IMPLANT
KIT ROOM TURNOVER OR (KITS) ×3 IMPLANT
NEEDLE ASP BONE MRW 8GX15 (NEEDLE) ×3 IMPLANT
NEEDLE HYPO 18GX1.5 BLUNT FILL (NEEDLE) ×3 IMPLANT
NEEDLE HYPO 25X1 1.5 SAFETY (NEEDLE) ×3 IMPLANT
NEEDLE SPNL 18GX3.5 QUINCKE PK (NEEDLE) ×3 IMPLANT
NS IRRIG 1000ML POUR BTL (IV SOLUTION) ×3 IMPLANT
OIL CARTRIDGE MAESTRO DRILL (MISCELLANEOUS) ×3
PACK LAMINECTOMY NEURO (CUSTOM PROCEDURE TRAY) ×3 IMPLANT
PAD ARMBOARD 7.5X6 YLW CONV (MISCELLANEOUS) ×9 IMPLANT
RUBBERBAND STERILE (MISCELLANEOUS) ×6 IMPLANT
SPONGE LAP 4X18 X RAY DECT (DISPOSABLE) IMPLANT
SPONGE SURGIFOAM ABS GEL SZ50 (HEMOSTASIS) ×3 IMPLANT
STRIP CLOSURE SKIN 1/2X4 (GAUZE/BANDAGES/DRESSINGS) IMPLANT
SUT VIC AB 0 CT1 18XCR BRD8 (SUTURE) ×1 IMPLANT
SUT VIC AB 0 CT1 8-18 (SUTURE) ×2
SUT VIC AB 2-0 CT1 18 (SUTURE) IMPLANT
SUT VICRYL 3-0 RB1 18 ABS (SUTURE) ×6 IMPLANT
SYR 3ML LL SCALE MARK (SYRINGE) ×3 IMPLANT
SYR CONTROL 10ML LL (SYRINGE) ×3 IMPLANT
TOWEL GREEN STERILE (TOWEL DISPOSABLE) ×3 IMPLANT
TOWEL GREEN STERILE FF (TOWEL DISPOSABLE) ×2 IMPLANT
WATER STERILE IRR 1000ML POUR (IV SOLUTION) ×3 IMPLANT

## 2016-09-16 NOTE — Consult Note (Signed)
CONSULT PROGRESS NOTE    Zachary Mccarthy  GNF:[id number] DOB: 26-Sep-1940 DOA: 09/15/2016 PCP: [name], MD    Brief Narrative: Zachary Mccarthy is a 76 year old male with a medical history significant for hypertension, hyperlipidemia, chronic kidney disease stage 3, dementia, and s/p TURBT for noninvansive bladder cancer on surveillance with urology who was admitted to the hospital directly from Dr. Kathyrn Sheriff (Neurosurgery)'s office for severe right side leg pain radiating to right thigh and knee that has been progressive x 5 months and has left patient wheelchair bound. MRI spine on 4/12 showed pathologic L4 compression fracture with greater than 50% loss of height and nonspecific shotty retroperitoneal lymphadenopathy. Patient is scheduled to undergo right L4-5 decompression with biopsy of the L4 on 5/1. Medicine team was consulted to manage patient while he remains at the hospital throughout his procedure.   Assessment & Plan:   Active Problems:   Pathologic compression fracture of spine (HCC)   Essential hypertension   Hyperlipidemia   Chronic kidney disease   Preop cardiovascular exam   Confusion  Pathologic compression fracture of spine - Patient with a 5 month history of progressive right leg pain radiating to right thigh and knee had an MRI confirming pathologic L4 compression fracture. - Scheduled for right L4-5 laminotomy/foraminotomy with L4 biopsy on 5/1. - Followed by neurosurgery - Due to pathologic nature of fracture and 20 lb weight loss since symptom onset, concern for malignancy will require outpatient oncology follow up - Pain well controlled with opiates prn   Preop cardiovascular exam - EKG is sinus rhythm with PVCs, L axis deviation, and incomplete L-BBB - CXR shows multiple right pulmonary nodules, likely granulomata. Right base collapse/consolidation with small right pleural effusion. Enlargement of the cardiopericardial silhouette, likely cardiomegaly although  pericardial effusion not excluded. - BP 150/90, HR 70. RR 18, O2 sat 95% on RA. - Patient is stable to continue with surgery. 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.  Chronic kidney disease stage 3 - Likely in the setting of bladder cancer - Cr trending down to 1.44 (better than his baseline of 1.5-1.9 in the last 2 years)  Hypertension - Stable at 150/90, HR 70 - Continue Norvasc 10 mg, Coreg 25 mg  Hyperlipidemia - Continue Zetia 10 mg, Zocor 20 mg.  Dementia - Patient was mildly confused and did not seem to remember he was undergoing spinal surgery today. - Monitor mentation and opiate use.   DVT prophylaxis: Holding heparin due to scheduled spinal surgery Code Status: Full Family Communication: Wife at bedside and informed of plan of care Disposition Plan: SNF for rehab  Consultants:    Procedures:   Right L4-5 laminotomy/foraminotomy with L4 biopsy 5/1  Antimicrobials:  none  Subjective: Patient is confused and did not seem to remember his spinal surgery had been scheduled for today. He states he has been able to get up and walk and his pain is well controlled. Denies chest pain, shortness of breath, leg swelling, fever/chills.   Objective: Vitals:   09/15/16 1903 09/15/16 2137 09/16/16 0053 09/16/16 0526  BP: (!) 139/50 140/76 (!) 151/72 (!) 147/77  Pulse: (!) 43 72 69 73  Resp: '19 20 18 20  '$ Temp: 99.6 F (37.6 C) 98.6 F (37  C) 98.4 F (36.9 C) 97.9 F (36.6 C)  TempSrc: Oral Oral Oral Oral  SpO2: 98% 98% 98% 98%  Weight:      Height:        Intake/Output Summary (Last 24 hours) at 09/16/16 1008 Last data filed at 09/16/16 0526  Gross per 24 hour  Intake             2000 ml  Output               200 ml  Net             1800 ml   Filed Weights   09/15/16 1306  Weight: 63.2 kg (139 lb 6.4 oz)    Examination:  General exam: Appears calm and comfortable  Respiratory system: Clear to auscultation. Respiratory effort normal. Cardiovascular system: S1 & S2 heard, RRR. No JVD, murmurs, rubs, gallops or clicks. No pedal edema. Gastrointestinal system: Abdomen is nondistended, soft and nontender. Normal bowel sounds heard. Central nervous system: Alert and oriented. No focal neurological deficits. Extremities: Symmetric 5 x 5 power. Skin: No rashes, lesions or ulcers Psychiatry: Mood & affect appropriate. Confused concerning his scheduled surgery today likely in the setting of dementia   Data Reviewed: I have personally reviewed following labs and imaging studies  CBC:  Recent Labs Lab 09/15/16 1507 09/16/16 0509  WBC 4.0 4.2  HGB 11.8* 12.1*  HCT 36.4* 37.7*  MCV 82.7 82.0  PLT 134* 093*   Basic Metabolic Panel:  Recent Labs Lab 09/15/16 1507 09/16/16 0509  NA 137 137  K 4.0 3.6  CL 105 105  CO2 24 24  GLUCOSE 114* 97  BUN 17 17  CREATININE 1.57* 1.44*  CALCIUM 9.2 9.1   GFR: Estimated Creatinine Clearance: 38.4 mL/min (A) (by C-G formula based on SCr of 1.44 mg/dL (H)). Liver Function Tests:  Recent Labs Lab 09/15/16 1507 09/16/16 0509  AST 26 26  ALT 16* 15*  ALKPHOS 35* 38  BILITOT 0.6 0.8  PROT 6.4* 6.1*  ALBUMIN 2.9* 2.6*   No results for input(s): LIPASE, AMYLASE in the last 168 hours. No results for input(s): AMMONIA in the last 168 hours. Coagulation Profile:  Recent Labs Lab 09/16/16 0509  INR 1.10   Cardiac Enzymes: No results for input(s): CKTOTAL, CKMB, CKMBINDEX, TROPONINI in the last 168 hours. BNP (last 3 results) No results for input(s): PROBNP in the last 8760 hours. HbA1C:  Recent Labs  09/15/16 1507  HGBA1C 6.5*   CBG:  Recent Labs Lab 09/15/16 1747 09/15/16 2135 09/16/16 0625  GLUCAP 94 106* 87   Lipid  Profile: No results for input(s): CHOL, HDL, LDLCALC, TRIG, CHOLHDL, LDLDIRECT in the last 72 hours. Thyroid Function Tests: No results for input(s): TSH, T4TOTAL, FREET4, T3FREE, THYROIDAB in the last 72 hours. Anemia Panel: No results for input(s): VITAMINB12, FOLATE, FERRITIN, TIBC, IRON, RETICCTPCT in the last 72 hours. Sepsis Labs: No results for input(s): PROCALCITON, LATICACIDVEN in the last 168 hours.  Recent Results (from the past 240 hour(s))  Surgical PCR screen     Status: None   Collection Time: 09/16/16  6:14 AM  Result Value Ref Range Status   MRSA, PCR NEGATIVE NEGATIVE Final   Staphylococcus aureus NEGATIVE NEGATIVE Final    Comment:        The Xpert SA Assay (FDA approved for NASAL specimens in patients over 43 years of age), is one component of a comprehensive surveillance program.  Test performance has been validated by [facility] for patients greater than or equal to 26 year old. It is not intended to diagnose infection nor to guide or monitor treatment.          Radiology Studies: Dg Chest 2 View  Result Date: 09/15/2016 CLINICAL DATA:  Preoperative respiratory evaluation. EXAM: CHEST  2 VIEW COMPARISON:  None. FINDINGS: The cardio pericardial silhouette is enlarged. Scattered nodules right lung may represent granulomata in there appears to be calcified lymph nodes in the right hilum. There is right base collapse/consolidation with small right pleural effusion. The visualized bony structures of the thorax are intact. IMPRESSION: 1. Multiple right pulmonary nodules, likely granulomata, but CT chest without contrast recommended to confirm. 2. Right base collapse/consolidation with small right pleural effusion. 3. Enlargement of the cardiopericardial silhouette, likely cardiomegaly although pericardial effusion not excluded. Electronically Signed   By: Misty Stanley M.D.   On: 09/15/2016 20:32        Scheduled Meds: . amLODipine  10 mg Oral Daily  .  carvedilol  25 mg Oral BID WC  . ezetimibe  10 mg Oral Daily  . heparin  5,000 Units Subcutaneous Q8H  . simvastatin  20 mg Oral q1800   Continuous Infusions: . lactated ringers 50 mL/hr at 09/15/16 1813     LOS: 1 day    Time spent: 30 minutes    Karington Zarazua, PA-Student   If 7PM-7AM, please contact night-coverage [email or web address] Password [email or web address] 09/16/2016, 10:08 AM

## 2016-09-16 NOTE — Anesthesia Preprocedure Evaluation (Addendum)
Anesthesia Evaluation  Patient identified by MRN, date of birth, ID band Patient awake    Reviewed: Allergy & Precautions, NPO status , Patient's Chart, lab work & pertinent test results  Airway Mallampati: II  TM Distance: >3 FB Neck ROM: Full    Dental  (+) Edentulous Upper, Edentulous Lower   Pulmonary neg pulmonary ROS, former smoker,    Pulmonary exam normal breath sounds clear to auscultation       Cardiovascular hypertension, Pt. on medications + Peripheral Vascular Disease  Normal cardiovascular exam Rhythm:Regular Rate:Normal     Neuro/Psych negative neurological ROS  negative psych ROS   GI/Hepatic negative GI ROS, Neg liver ROS,   Endo/Other  negative endocrine ROS  Renal/GU Renal disease     Musculoskeletal negative musculoskeletal ROS (+)   Abdominal   Peds  Hematology negative hematology ROS (+)   Anesthesia Other Findings   Reproductive/Obstetrics negative OB ROS                           Anesthesia Physical Anesthesia Plan  ASA: III  Anesthesia Plan: General   Post-op Pain Management:    Induction: Intravenous  Airway Management Planned: Oral ETT  Additional Equipment:   Intra-op Plan:   Post-operative Plan: Extubation in OR  Informed Consent: I have reviewed the patients History and Physical, chart, labs and discussed the procedure including the risks, benefits and alternatives for the proposed anesthesia with the patient or authorized representative who has indicated his/her understanding and acceptance.   Dental advisory given  Plan Discussed with: CRNA  Anesthesia Plan Comments:       Anesthesia Quick Evaluation

## 2016-09-16 NOTE — Progress Notes (Signed)
Radiologist called CT result to this writer. Who contacted the neurosurgeon on call to let him know about the result.

## 2016-09-16 NOTE — Progress Notes (Signed)
Writer tried to swab patient's nose for PCR, but patient refused at this time. Will try again.

## 2016-09-16 NOTE — Progress Notes (Signed)
No issues overnight. Pt has no new complaints.  EXAM:  BP (!) 147/77 (BP Location: Right Arm)   Pulse 73   Temp 97.9 F (36.6 C) (Oral)   Resp 20   Ht '5\' 9"'$  (1.753 m)   Wt 63.2 kg (139 lb 6.4 oz)   SpO2 98%   BMI 20.59 kg/m   Awake, alert, oriented  Speech fluent, appropriate  CN grossly intact  5/5 BUE/BLE   IMPRESSION:  76 y.o. male with likely pathologic L4 compression fracture with right L4-5 stenosis and associated radiculopathy Of note, patient has been off plavix for >3 days  PLAN: - Will proceed with right L4-5 laminotomy/foraminotomy with L4 biopsy  I have reviewed the risks, benefits, and alternatives to surgery with the patient and his wife. Risks were discussed to include but not limited to nerve injury resulting in leg/foot weakness/numbness, bleeding, infection, fluid leak, need for additional surgeries, and risks of anesthesia including stroke, MI, DVT/PE. All questions were answered and they are ready to proceed with surgery.

## 2016-09-16 NOTE — Transfer of Care (Signed)
Immediate Anesthesia Transfer of Care Note  Patient: Zachary Mccarthy  Procedure(s) Performed: Procedure(s): LUMBAR FOUR- LUMBAR FIVE LAMINOTOMY/FORAMINOTOMY, LUMBAR FOUR BIOPSY (Right)  Patient Location: PACU  Anesthesia Type:General  Level of Consciousness: awake and drowsy  Airway & Oxygen Therapy: Patient Spontanous Breathing and Patient connected to nasal cannula oxygen  Post-op Assessment: Report given to RN and Post -op Vital signs reviewed and stable  Post vital signs: Reviewed and stable  Last Vitals:  Vitals:   09/16/16 0526 09/16/16 1018  BP: (!) 147/77 (!) 150/90  Pulse: 73 70  Resp: 20 18  Temp: 36.6 C 36.3 C    Last Pain:  Vitals:   09/16/16 1018  TempSrc: Oral  PainSc:          Complications: No apparent anesthesia complications   Follows commands.  Moving extremities. No SOB or discomfort noted

## 2016-09-16 NOTE — Plan of Care (Signed)
Problem: Safety: Goal: Ability to remain free from injury will improve Outcome: Progressing No falls during this admission. Bed in low and locked position. Family at bedside. 3/4 siderails in place. Nonskid footwear being utilized. Clean and clear environment maintained. Call bell within reach. Patient and family verbalized understanding of safety instruction.

## 2016-09-16 NOTE — Op Note (Signed)
PREOP DIAGNOSIS:  1. Pathologic L4 compression fracture 2. 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 Lumbar radiculopathy  POSTOP DIAGNOSIS: Same  PROCEDURE: 1. Right L4-5 laminotomy and foraminotomy for decompression of nerve root 2. L4 vertebral body biopsy, transpedicular 3. Use of operating microscope   SURGEON: Dr. [name], MD  ASSISTANT: Dr. Ashok Pall, MD  ANESTHESIA: General Endotracheal  EBL: 50cc  SPECIMENS: L4 vertebra for permanent pathology  DRAINS: None  COMPLICATIONS: None immediate  CONDITION: Stable to PACU  HISTORY: Zachary Mccarthy is a 76 y.o. male admitted to the hospital with intractable right leg pain, inability to walk, likely pathologic L4 compression fracture. With the concern for underlying malignancy and severe pain, surgical decompression and biopsy was indicated. Risks and benefits were reviewed with the patient and his wife. All questions were answered and consent was obtained.  PROCEDURE IN DETAIL: After informed consent was obtained and witnessed, the patient was brought to the operating room. After induction of general anesthesia, the patient was positioned on the operative table in the prone position with all pressure points meticulously padded. The skin of the low back was then prepped and draped in the usual sterile fashion.  Under fluoroscopy, the correct level was identified and marked out on the skin, and after timeout was conducted, Jamshidi needle was introduced into the right L4 pedicle. Biopsy of the L4 vertebral body was obtained and sent for permanent pathology.  Midline skin incision was then made sharply after infiltration with local, and Bovie electrocautery was used to dissect the subcutaneous tissue until the lumbodorsal fascia was identified. The fascia was then incised using Bovie electrocautery and the lamina at the L4 level was identified and dissection was carried out in the subperiosteal plane. Self-retaining retractor was  then placed, and intraoperative fluoro was taken to confirm we were at the correct level.  Using a high-speed drill, laminotomy was completed with a partial medial facetectomy. The ligamentum flavum was then identified and removed and the lateral edge of the thecal sac was identified. The right L4 nerve root was identified and appeared to be compressed within the foramen. Foraminotomy was therefore complete with high-speed drill, Kerrison rongeurs, and curved curettes. I was able to pass a blunt dissector into the foramen after decompression.  Hemostasis was then secured using a combination of morcellized Gelfoam and thrombin and bipolar electrocautery. The wound is irrigated with copious amounts of antibiotic saline irrigation. The nerve root was then covered with a long-acting steroid solution. Self-retaining retractor was then removed, and the wound is closed in layers using a combination of interrupted 0 Vicryl and 3-0 Vicryl stitches. The skin was closed using standard skin glue.  At the end of the case all sponge, needle, and instrument counts were correct. The patient was then transferred to the stretcher and taken to the postanesthesia care unit in stable hemodynamic condition.

## 2016-09-16 NOTE — Progress Notes (Signed)
Initial Nutrition Assessment  DOCUMENTATION CODES:   Severe malnutrition in context of chronic illness  INTERVENTION:   Ensure Enlive po TID, each supplement provides 350 kcal and 20 grams of protein (vanilla flavor)   NUTRITION DIAGNOSIS:   Malnutrition (Severe) related to chronic illness (cancer) as evidenced by severe depletion of body fat, severe depletion of muscle mass, energy intake < or equal to 75% for > or equal to 1 month, percent weight loss.  GOAL:   Patient will meet greater than or equal to 90% of their needs  MONITOR:   PO intake, Supplement acceptance  REASON FOR ASSESSMENT:   Consult Assessment of nutrition requirement/status  ASSESSMENT:   Pt with hx HTN, hyperlipidemia, CKD stage 3, dementia, and s/p TURBT for noninvasive bladder cancer admitted directly from Dr Kathyrn Sheriff office for pathologic L4 compression fx. Plan for L4-5 decompression with biopsy of the L4.   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   Pt and wife provide hx. Pt seemed to at times be unable to answer questions, he seemed to lose focus.  Pt has lost 17% of his body weight (28 lb) since at least December which is when pt feels he started to lose weight but wife also reports that she saw a bigger decline in March.  He only eats 2 meals per day. Breakfast: toast, sausage; no lunch; Dinner: can of spaghetti and meatballs; does like ensure Nutrition-Focused physical exam completed. Findings are severe fat depletion, severe muscle depletion, and no edema.     Diet Order:  Diet regular Room service appropriate? Yes; Fluid consistency: Thin  Skin:  Reviewed, no issues  Last BM:  4/30  Height:   Ht Readings from Last 1 Encounters:  09/16/16 '5\' 9"'$  (1.753 m)    Weight:   Wt Readings from Last 1 Encounters:  09/16/16 147 lb 4.8 oz (66.8 kg)    Ideal Body Weight:  72.7 kg  BMI:  Body mass index is 21.75 kg/m.  Estimated Nutritional Needs:   Kcal:   1700-1900  Protein:  85-100 grams  Fluid:  > 1.6 L/day  EDUCATION NEEDS:   No education needs identified at this time  [facility], [place], [place] Pager [phone] After Hours Pager

## 2016-09-16 NOTE — Anesthesia Procedure Notes (Signed)
Procedure Name: Intubation Date/Time: 09/16/2016 4:12 PM Performed by: Oletta Lamas Pre-anesthesia Checklist: Patient identified, Emergency Drugs available, Suction available and Patient being monitored Patient Re-evaluated:Patient Re-evaluated prior to inductionOxygen Delivery Method: Circle System Utilized Preoxygenation: Pre-oxygenation with 100% oxygen Intubation Type: IV induction Ventilation: Mask ventilation without difficulty Laryngoscope Size: Miller and 1 Grade View: Grade I Tube type: Oral Tube size: 7.5 mm Number of attempts: 1 Airway Equipment and Method: Stylet and Oral airway Placement Confirmation: ETT inserted through vocal cords under direct vision,  positive ETCO2 and breath sounds checked- equal and bilateral Secured at: 23 cm Tube secured with: Tape Dental Injury: Teeth and Oropharynx as per pre-operative assessment

## 2016-09-17 ENCOUNTER — Encounter (HOSPITAL_COMMUNITY): Payer: Self-pay | Admitting: Neurosurgery

## 2016-09-17 DIAGNOSIS — I739 Peripheral vascular disease, unspecified: Secondary | ICD-10-CM

## 2016-09-17 DIAGNOSIS — M899 Disorder of bone, unspecified: Secondary | ICD-10-CM

## 2016-09-17 DIAGNOSIS — M4850XD Collapsed vertebra, not elsewhere classified, site unspecified, subsequent encounter for fracture with routine healing: Secondary | ICD-10-CM

## 2016-09-17 DIAGNOSIS — Z87891 Personal history of nicotine dependence: Secondary | ICD-10-CM

## 2016-09-17 MED ORDER — PAMIDRONATE DISODIUM 90 MG/10ML IV SOLN
90.0000 mg | Freq: Once | INTRAVENOUS | Status: AC
  Administered 2016-09-18: 90 mg via INTRAVENOUS
  Filled 2016-09-17 (×2): qty 10

## 2016-09-17 MED ORDER — ENSURE ENLIVE PO LIQD
237.0000 mL | Freq: Three times a day (TID) | ORAL | Status: DC
  Administered 2016-09-17 – 2016-09-28 (×25): 237 mL via ORAL
  Filled 2016-09-17 (×35): qty 237

## 2016-09-17 MED ORDER — DEXAMETHASONE SODIUM PHOSPHATE 4 MG/ML IJ SOLN
4.0000 mg | Freq: Four times a day (QID) | INTRAMUSCULAR | Status: DC
  Administered 2016-09-17 – 2016-10-02 (×56): 4 mg via INTRAVENOUS
  Filled 2016-09-17 (×56): qty 1

## 2016-09-17 NOTE — Progress Notes (Signed)
No issues overnight. Pt reports significant improvement in right leg pain. Was trying to get OOB last night. CT chest/head completed last night.  EXAM:  BP (!) 136/59 (BP Location: Right Arm)   Pulse 74   Temp 98.4 F (36.9 C) (Oral)   Resp 18   Ht '5\' 9"'$  (1.753 m)   Wt 66.8 kg (147 lb 4.8 oz)   SpO2 98%   BMI 21.75 kg/m   Awake, alert Speech fluent, appropriate  CN grossly intact  5/5 BUE/BLE   IMAGING: CT chest reviewed which demonstrates large right peri-hilar mass.  CT head demonstrates right cerebellar lesion with surrounding edema, effacement of the 4th ventricle and resultant obstructive hydrocephalus.  IMPRESSION:  76 y.o. male with likely metastatic lung CA and L4 compression fracture. Vertebral biopsy from surgery yesterday is pending. Will hopefully get diagnosis from vertebral biopsy. More pressing issue is the large cerebellar lesion with hydrocephalus. I suspect he will need surgery for this for relief of mass effect/hydrocephalus.  PLAN: - Will start decadron - Order MRI brain w/w/o Gad, SRS protocol

## 2016-09-17 NOTE — Progress Notes (Signed)
PROGRESS NOTE                                                                                                                                                                                                             Patient Demographics:    Zachary Mccarthy, is a 76 y.o. male, DOB - 1940-06-02, SAY:[id number]  Admit date - 09/15/2016   Admitting Physician [name], MD  Outpatient Primary MD for the patient is [name], MD  LOS - 2  Outpatient Specialists:  No chief complaint on file.      Brief Narrative   76 year old male wit history of hypertension, hyperlipidemia, chronic kidney disease stage III, mild dementia, known invasive bladder cancer status post TURBT (currently on surveillance) admitted to neurosurgery service for severe right-sided back pain radiating to the right thigh and knee, progressive for the past 5 months severely limiting his mobility. (He is now wheelchair bound). MRI of the spine showed pathologic L4 compression fracture. On 5/1 ,Underwent right L4-5 laminectomy and foraminotomy with decompression, had L4 vertebral body biopsy. CT of the chest shows large right perihilar and infrahilar mass with mediastinal adenopathy and small right pleural effusion with postobstructive right lobe consolidation. Also has calcified pulmonary nodules on the right. Hypodense lesions in the liver also found. CT of the head done given findings of pulmonary nodules and some confusion showed right posterior parietal lobe hemorrhagic metastases with some mass effect.  Hospitalist consulting for acute confusion and other medical issues.   Subjective:   Patient denies back pain today. He seems more oriented on exam.   Assessment  & Plan :   Pathological compression fracture of lumbar spine Suspect secondary to metastasis from lung primary. Symptoms progressive over past 5 months associated with about 20 pound  weight loss. -PT evaluation. Pain control per primary team.  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 -CT chest and head findings as above. MRI brain ordered to further evaluate brain metastases. Started on Decadron for cerebral edema. Follow vertebral body biopsy results.  -I have consulted oncology (Dr Marin Olp) who will see pt later today.  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 - S/p TURBT with negative cystoscopy on 06/03/2016 and has not had recurrence since 2015. - Followed by outpatient urology with [name].    Essential hypertension  Stable. Continue Norvasc and Coreg.    Hyperlipidemia Continue Zocor and Zetia.    Chronic kidney disease stage III Creatinine at baseline (4.5-8.0)  Acute metabolic encephalopathy Mild confusion on evaluation yesterday. Currently resolved. CT chest comments on postobstructive consolidation patient does not have any respiratory symptoms of fever.     Code Status : Full code  Family Communication  : Wife at bedside  Disposition Plan  : Per primary team and further hospital course.    Procedures  :   right L4-5 laminectomy and foraminotomy with decompression,   L4 vertebral body biopsy.   DVT Prophylaxis  :  Lovenox -  Lab Results  Component Value Date   PLT 147 (L) 09/16/2016    Antibiotics  :    Anti-infectives    Start     Dose/Rate Route Frequency Ordered Stop   09/17/16 0000  ceFAZolin (ANCEF) IVPB 2g/100 mL premix     2 g 200 mL/hr over 30 Minutes Intravenous Every 8 hours 09/16/16 2015 09/17/16 0827   09/16/16 1700  bacitracin 50,000 Units in sodium chloride irrigation 0.9 % 500 mL irrigation  Status:  Discontinued       As needed 09/16/16 1700 09/16/16 1835   09/16/16 1547  ceFAZolin (ANCEF) 2-4 GM/100ML-% IVPB    Comments:  Gallman, Kathie   : cabinet override      09/16/16 1547 09/17/16 0359        Objective:   Vitals:   09/17/16 0057 09/17/16 0533  09/17/16 0700 09/17/16 0948  BP: 134/65 (!) 141/65 (!) 150/59 (!) 136/59  Pulse: 72 76 79 74  Resp: '18 18 18 18  '$ Temp: 97.5 F (36.4 C) 97.2 F (36.2 C) 97.8 F (36.6 C) 98.4 F (36.9 C)  TempSrc: Axillary Oral Oral Oral  SpO2: 94% 96% 97% 98%  Weight:      Height:        Wt Readings from Last 3 Encounters:  09/16/16 66.8 kg (147 lb 4.8 oz)     Intake/Output Summary (Last 24 hours) at 09/17/16 1201 Last data filed at 09/17/16 0105  Gross per 24 hour  Intake             2350 ml  Output               75 ml  Net             2275 ml     Physical Exam  Gen: not in distress HEENT:  moist mucosa, supple neck,No cervical or supraclavicular lymphadenopathy. Chest: clear b/l, no added sounds CVS: N S1&S2, no murmurs,  GI: soft, NT, ND,  Musculoskeletal: warm, no edema, clean Dressing over lower back  CNS: Alert and oriented, nonfocal    Data Review:    CBC  Recent Labs Lab 09/15/16 1507 09/16/16 0509  WBC 4.0 4.2  HGB 11.8* 12.1*  HCT 36.4* 37.7*  PLT 134* 147*  MCV 82.7 82.0  MCH 26.8 26.3  MCHC 32.4 32.1  RDW 15.1 15.0    Chemistries   Recent Labs Lab 09/15/16 1507 09/16/16 0509  NA 137 137  K 4.0 3.6  CL 105 105  CO2 24 24  GLUCOSE 114* 97  BUN 17 17  CREATININE 1.57* 1.44*  CALCIUM 9.2 9.1  AST 26 26  ALT 16* 15*  ALKPHOS 35* 38  BILITOT 0.6 0.8   ------------------------------------------------------------------------------------------------------------------ No results for input(s): CHOL, HDL, LDLCALC, TRIG, CHOLHDL, LDLDIRECT in the last 72 hours.  Lab Results  Component Value Date   HGBA1C 6.5 (H) 09/15/2016   ------------------------------------------------------------------------------------------------------------------ No results for input(s): TSH, T4TOTAL, T3FREE, THYROIDAB in the last 72 hours.  Invalid input(s):  FREET3 ------------------------------------------------------------------------------------------------------------------ No results for input(s): VITAMINB12, FOLATE, FERRITIN, TIBC, IRON, RETICCTPCT in the last 72 hours.  Coagulation profile  Recent Labs Lab 09/16/16 0509  INR 1.10    No results for input(s): DDIMER in the last 72 hours.  Cardiac Enzymes No results for input(s): CKMB, TROPONINI, MYOGLOBIN in the last 168 hours.  Invalid input(s): CK ------------------------------------------------------------------------------------------------------------------ No results found for: BNP  Inpatient Medications  Scheduled Meds: . amLODipine  10 mg Oral Daily  . carvedilol  25 mg Oral BID WC  . dexamethasone  4 mg Intravenous Q6H  . docusate sodium  100 mg Oral BID  . ezetimibe  10 mg Oral Daily  . gabapentin  300 mg Oral TID  . ketorolac  7.5 mg Intravenous Q6H  . pantoprazole (PROTONIX) IV  40 mg Intravenous QHS  . senna  1 tablet Oral BID  . simvastatin  20 mg Oral q1800  . sodium chloride flush  3 mL Intravenous Q12H   Continuous Infusions: . sodium chloride 75 mL/hr at 09/16/16 2051  . sodium chloride    . lactated ringers 50 mL/hr at 09/15/16 1813  . methocarbamol (ROBAXIN)  IV     PRN Meds:.bisacodyl, hydrALAZINE, HYDROcodone-acetaminophen, menthol-cetylpyridinium **OR** phenol, methocarbamol **OR** methocarbamol (ROBAXIN)  IV, morphine injection, ondansetron **OR** ondansetron (ZOFRAN) IV, polyethylene glycol, sodium chloride flush, sodium phosphate  Micro Results Recent Results (from the past 240 hour(s))  Surgical PCR screen     Status: None   Collection Time: 09/16/16  6:14 AM  Result Value Ref Range Status   MRSA, PCR NEGATIVE NEGATIVE Final   Staphylococcus aureus NEGATIVE NEGATIVE Final    Comment:        The Xpert SA Assay (FDA approved for NASAL specimens in patients over 66 years of age), is one component of a comprehensive  surveillance program.  Test performance has been validated by [facility] for patients greater than or equal to 80 year old. It is not intended to diagnose infection nor to guide or monitor treatment.     Radiology Reports Dg Chest 2 View  Result Date: 09/15/2016 CLINICAL DATA:  Preoperative respiratory evaluation. EXAM: CHEST  2 VIEW COMPARISON:  None. FINDINGS: The cardio pericardial silhouette is enlarged. Scattered nodules right lung may represent granulomata in there appears to be calcified lymph nodes in the right hilum. There is right base collapse/consolidation with small right pleural effusion. The visualized bony structures of the thorax are intact. IMPRESSION: 1. Multiple right pulmonary nodules, likely granulomata, but CT chest without contrast recommended to confirm. 2. Right base collapse/consolidation with small right pleural effusion. 3. Enlargement of the cardiopericardial silhouette, likely cardiomegaly although pericardial effusion not excluded. Electronically Signed   By: Misty Stanley M.D.   On: 09/15/2016 20:32   Dg Lumbar Spine 2-3 Views  Result Date: 09/16/2016 CLINICAL DATA:  L4-5 laminectomy/foraminectomy. EXAM: LUMBAR SPINE - 2-3 VIEW; DG C-ARM 61-120 MIN COMPARISON:  MRI lumbar spine 08/28/2016 FINDINGS: Two spot fluoroscopic images of the lumbar spine demonstrate a surgical instrument over the posterior elements at the L4-5 level. Known L4 compression fracture. Mild spondylosis of the spine. Disc space narrowing at the L5-S1 level. Surgical clips are present anterior to the spine likely from previous vascular surgery. IMPRESSION: Mild spondylosis of the lumbar spine with disc disease at the L5-S1 level. Known L4 compression fracture  with surgical instrument over the posterior elements at the L4-5 level. Electronically Signed   By: Marin Olp M.D.   On: 09/16/2016 18:54   Ct Head Wo Contrast  Result Date: 09/16/2016 CLINICAL DATA:  Pulmonary nodules, confusion EXAM:  CT HEAD WITHOUT CONTRAST TECHNIQUE: Contiguous axial images were obtained from the base of the skull through the vertex without intravenous contrast. COMPARISON:  Same day chest CT FINDINGS: The study is compromised by motion artifacts. Brain: There is an ill-defined small right posterior parietal intra-axial hyperdensity seen, series 3, image 20 with an approximate 3.5 cm hypodense masslike abnormality in the right cerebellar hemisphere causing positive mass effect on the fourth ventricle. Findings are concerning for metastatic disease and edema given recent findings within the chest CT. More confluent areas of hypodensity are seen in the left frontal white matter likely representing transependymal spread of CSF. No extra-axial fluid collections are identified. Postobstructive dilatation on the lateral and third ventricles from mass effect on the fourth ventricle is noted. Vascular: No hyperdense vessel or unexpected calcification. Skull: Negative for focal lesions.  No fracture. Sinuses/Orbits: Limited by motion artifacts.  No acute finding. Other: None IMPRESSION: Intra-axial ill-defined hyperdensity in the posterior right parietal lobe may represent a small hemorrhagic metastasis. An additional 3.5 cm hypodense mass with adjacent edema in the right cerebellar hemisphere with positive mass effect on the fourth ventricle is also noted. These in conjunction CT chest findings are concerning for metastatic disease. MRI may help for further correlation. Postobstructive dilatation of the lateral third ventricles secondary to mass-effect of the fourth ventricle with transependymal spread of CSF in the left frontal lobe. Critical Value/emergent results were called by telephone at the time of interpretation on 09/16/2016 at 11:29 pm to Nurse Elita Boone, who verbally acknowledged these results and will be contacting the oncall Neurosurgeon immediately. Electronically Signed   By: Ashley Royalty M.D.   On: 09/16/2016 23:29    Ct Chest Wo Contrast  Result Date: 09/16/2016 CLINICAL DATA:  Confusion, pulmonary nodules EXAM: CT CHEST WITHOUT CONTRAST TECHNIQUE: Multidetector CT imaging of the chest was performed following the standard protocol without IV contrast. COMPARISON:  None. FINDINGS: Cardiovascular: Cardiomegaly with small pericardial effusion and coronary arteriosclerosis. There is aortic atherosclerosis with aneurysmal dilatation of the ascending aorta up to 4.2 cm. There is dilatation of the main pulmonary artery to 3.9 cm consistent with a component of pulmonary hypertension. Atherosclerosis in the proximal great vessels is identified. Mediastinum/Nodes: Necrotic appearing cervical adenopathy is seen at the base of the neck on the left with central areas of hypodensity. Prevascular adenopathy measuring up to 1.5 cm short axis Left lower paratracheal 1.7 cm short axis lymphadenopathy is noted. The trachea is patent. The left mainstem bronchus is patent. There is narrowing of the right mainstem bronchus and bronchi to the right middle and lower lobes secondary to a mass about the right hilum described below. Lungs/Pleura: There is a large masslike abnormality in the infrahilar right hemithorax abutting the right heart border and measuring roughly 11 x 7.3 x 5.8 cm in AP by transverse by CC dimension. Exact margins are difficult to distinguish given lack of IV contrast and also from postobstructive changes to the right middle and lower lobes. Additionally there are calcified nodules in the right upper, middle and lower lobes consistent granulomas. Right upper lobe 4.1 x 3.1 mm noncalcified nodule, series 5, image 31, oblong 8.1 x 2.9 mm nodule series 5, image 35, posterior segment right upper lobe 5.3 mm nodule  series 5, image 70, superior segment right lower lobe series 5, image 83 4.6 mm nodules are also seen potentially representing metastatic nodules or noncalcified granulomas in light of the above findings. Small right  pleural effusion is present. Upper Abdomen: Calcified granuloma in the right middle lobe. Indeterminate 12 mm hypodense lesion in the left hepatic lobe series 4, image 165 and the right hepatic dome mm hypodensity, series 4, image 129 are noted. Mild thickening of the adrenal glands. Aortic and branch vessel atherosclerosis is identified within the upper abdomen. There is mild fullness of both renal collecting systems. Musculoskeletal: No suspicious lytic or blastic disease. IMPRESSION: 1. Large right perihilar and infrahilar masslike abnormality measuring 11 x 7.3 x 5.8 cm with mediastinal adenopathy. Associated small right pleural effusion with postobstructive consolidations in the right lower lobe. Findings are concerning for bronchogenic carcinoma with metastatic adenopathy. There appears be cervical necrotic adenopathy at the base of the neck on the left for which CT or MRI with IV contrast is also recommended for better assessment. 2. Calcified and noncalcified subcentimeter pulmonary nodules are noted predominantly within the right lung. A calcified nodules may simply reflect granulomas however the noncalcified nodules cannot exclude metastatic pulmonary nodules. 3. Indeterminate hypodense lesions in the liver measuring 7 mm on the right and 12 mm on the left. 4. Aortic atherosclerosis.  Coronary arteriosclerosis. 5. 4.2 cm ascending aortic aneurysm. Recommend annual imaging followup by CTA or MRA. This recommendation follows 2010 ACCF/AHA/AATS/ACR/ASA/SCA/SCAI/SIR/STS/SVM Guidelines for the Diagnosis and Management of Patients with Thoracic Aortic Disease. Circulation. 2010; 121: A579-U383 Electronically Signed   By: Ashley Royalty M.D.   On: 09/16/2016 23:11   Dg C-arm 1-60 Min  Result Date: 09/16/2016 CLINICAL DATA:  L4-5 laminectomy/foraminectomy. EXAM: LUMBAR SPINE - 2-3 VIEW; DG C-ARM 61-120 MIN COMPARISON:  MRI lumbar spine 08/28/2016 FINDINGS: Two spot fluoroscopic images of the lumbar spine  demonstrate a surgical instrument over the posterior elements at the L4-5 level. Known L4 compression fracture. Mild spondylosis of the spine. Disc space narrowing at the L5-S1 level. Surgical clips are present anterior to the spine likely from previous vascular surgery. IMPRESSION: Mild spondylosis of the lumbar spine with disc disease at the L5-S1 level. Known L4 compression fracture with surgical instrument over the posterior elements at the L4-5 level. Electronically Signed   By: Marin Olp M.D.   On: 09/16/2016 18:54    Time Spent in minutes  25   Louellen Molder M.D on 09/17/2016 at 12:01 PM  Between 7am to 7pm - Pager - [phone]  After 7pm go to [email or web address] - password [email or web address]  [facility] -  Office  [phone]

## 2016-09-17 NOTE — Consult Note (Signed)
Referral MD  Reason for Referral: L4 pathologic compression fracture, right cerebellar med, right hilar mass.   No chief complaint on file. : My right leg gave out.  HPI: Zachary Mccarthy is a very nice 76 year old African-American male. He definitely looks younger. I see him with some of his family members.  He was doing pretty well until about a month ago. S1 his wife said that he began to have problems with pain. He had lower back pain with progressive pain in the right leg.  He ultimately was admitted on April 30. He had a an x-ray done. He appeared to have a compression fracture at L4. This appeared to be compressing the right L4 nerve root. He underwent a laminectomy. He had de-compression of the nerve root. Biopsies were taken of the L for vertebral body.  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 He had been having some confusion. He had some dizziness. The CT scan showed a 3.5 cm mass in the right cerebellar hemisphere. This is causing a mass effect on the fourth ventricle. I think he is due for an MRI tonight or tomorrow.  He had a CT scan of the chest. This showed a large masslike abnormality in the right infrahilar region. This measured 11 x 7.3 x 5.8 cm. He had a right upper lobe nodule measuring 4.1 x 3.1 mm. He had additional pulmonary nodules. There was some necrotic-appearing cervical adenopathy at the base of the neck on the left.  His labs do not show any hyponatremia. There is no hypercalcemia. He is not anemic. There is no thrombocytosis. His liver tests look okay.  We were asked to see him to provide input as to possible management recommendations.  He does have a past history of tobacco use. He stopped in the 80s. He cannot tell me how much he smoked. There may be some occupational exposures. 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 There is no history of malignancy in the family.  He does have  peripheral vascular disease. He has had bypass surgery for his legs.  He currently is on Decadron for CNS edema.      Past Medical History:  Diagnosis Date  . Compression fracture of spine ([place]) 08/2016  . Hyperlipidemia   . Hypertension   :  Past Surgical History:  Procedure Laterality Date  . CATARACT EXTRACTION    . HERNIA REPAIR    . LUMBAR LAMINECTOMY/DECOMPRESSION MICRODISCECTOMY Right 09/16/2016   Procedure: LUMBAR FOUR- LUMBAR FIVE LAMINOTOMY/FORAMINOTOMY, LUMBAR FOUR BIOPSY;  Surgeon: [name], MD;  Location: [facility];  Service: Neurosurgery;  Laterality: Right;  [facility] Kitchen VASCULAR SURGERY    :   Current Facility-Administered Medications:  .  0.9 %  sodium chloride infusion, , Intravenous, Continuous, [name], MD, Last Rate: 75 mL/hr at 09/17/16 1203 .  0.9 %  sodium chloride infusion, 250 mL, Intravenous, Continuous, [name], MD .  amLODipine (NORVASC) tablet 10 mg, 10 mg, Oral, Daily, [name], MD, 10 mg at 09/17/16 1014 .  bisacodyl (DULCOLAX) suppository 10 mg, 10 mg, Rectal, Daily PRN, [name], MD .  carvedilol (COREG) tablet 25 mg, 25 mg, Oral, BID WC, [name], MD, 25 mg at 09/17/16 0757 .  dexamethasone (DECADRON) injection 4 mg, 4 mg, Intravenous, Q6H, [name], MD, 4 mg at 09/17/16 1204 .  docusate sodium (COLACE) capsule 100 mg, 100 mg, Oral, BID, [name], MD, 100  mg at 09/17/16 1014 .  ezetimibe (ZETIA) tablet 10 mg, 10 mg, Oral, Daily, [name], MD, 10 mg at 09/17/16 1014 .  feeding supplement (ENSURE ENLIVE) (ENSURE ENLIVE) liquid 237 mL, 237 mL, Oral, TID BM, [name], MD, 237 mL at 09/17/16 1558 .  gabapentin (NEURONTIN) capsule 300 mg, 300 mg, Oral, TID, [name], MD, 300 mg at 09/17/16 1014 .  hydrALAZINE (APRESOLINE) injection 5-10 mg, 5-10 mg, Intravenous, Q4H PRN, Waldemar Dickens, MD .  HYDROcodone-acetaminophen (NORCO/VICODIN) 5-325 MG per tablet 1-2 tablet,  1-2 tablet, Oral, Q4H PRN, [name], MD .  lactated ringers infusion, , Intravenous, Continuous, Rondel Jumbo, PA-C, Last Rate: 50 mL/hr at 09/15/16 1813 .  menthol-cetylpyridinium (CEPACOL) lozenge 3 mg, 1 lozenge, Oral, PRN **OR** phenol (CHLORASEPTIC) mouth spray 1 spray, 1 spray, Mouth/Throat, PRN, [name], MD .  methocarbamol (ROBAXIN) tablet 500 mg, 500 mg, Oral, Q6H PRN **OR** methocarbamol (ROBAXIN) 500 mg in dextrose 5 % 50 mL IVPB, 500 mg, Intravenous, Q6H PRN, [name], MD .  morphine 4 MG/ML injection 1-2 mg, 1-2 mg, Intravenous, Q3H PRN, Kristeen Miss, MD, 2 mg at 09/17/16 1558 .  ondansetron (ZOFRAN) tablet 4 mg, 4 mg, Oral, Q6H PRN **OR** ondansetron (ZOFRAN) injection 4 mg, 4 mg, Intravenous, Q6H PRN, [name], MD .  pantoprazole (PROTONIX) injection 40 mg, 40 mg, Intravenous, QHS, [name], MD, 40 mg at 09/16/16 2101 .  polyethylene glycol (MIRALAX / GLYCOLAX) packet 17 g, 17 g, Oral, Daily PRN, [name], MD .  senna (SENOKOT) tablet 8.6 mg, 1 tablet, Oral, BID, [name], MD, 8.6 mg at 09/17/16 1014 .  simvastatin (ZOCOR) tablet 20 mg, 20 mg, Oral, q1800, [name], MD, 20 mg at 09/15/16 1804 .  sodium chloride flush (NS) 0.9 % injection 3 mL, 3 mL, Intravenous, Q12H, [name], MD, 3 mL at 09/17/16 1000 .  sodium chloride flush (NS) 0.9 % injection 3 mL, 3 mL, Intravenous, PRN, [name], MD .  sodium phosphate (FLEET) 7-19 GM/118ML enema 1 enema, 1 enema, Rectal, Once PRN, [name], MD:  . amLODipine  10 mg Oral Daily  . carvedilol  25 mg Oral BID WC  . dexamethasone  4 mg Intravenous Q6H  . docusate sodium  100 mg Oral BID  . ezetimibe  10 mg Oral Daily  . feeding supplement (ENSURE ENLIVE)  237 mL Oral TID BM  . gabapentin  300 mg Oral TID  . pantoprazole (PROTONIX) IV  40 mg Intravenous QHS  . senna  1 tablet Oral BID  . simvastatin  20 mg Oral q1800  . sodium chloride flush   3 mL Intravenous Q12H  :  Allergies  Allergen Reactions  . No Known Allergies   :  Family History  Problem Relation Age of Onset  . Family history unknown: Yes  :  Social History   Social History  . Marital status: Married    Spouse name: N/A  . Number of children: N/A  . Years of education: N/A   Occupational History  . Not on file.   Social History Main Topics  . Smoking status: Former Research scientist (life sciences)  . Smokeless tobacco: Never Used     Comment: quit smoking in the 80's   . Alcohol use No  . Drug use: No  . Sexual activity: Not on file   Other Topics Concern  . Not on file   Social History Narrative  . No narrative on file  :  Pertinent items  are noted in HPI.  Exam: Patient Vitals for the past 24 hrs:  BP Temp Temp src Pulse Resp SpO2 Height Weight  09/17/16 0948 (!) 136/59 98.4 F (36.9 C) Oral 74 18 98 % - -  09/17/16 0700 (!) 150/59 97.8 F (36.6 C) Oral 79 18 97 % - -  09/17/16 0533 (!) 141/65 97.2 F (36.2 C) Oral 76 18 96 % - -  09/17/16 0057 134/65 97.5 F (36.4 C) Axillary 72 18 94 % - -  09/16/16 2015 (!) 150/76 97.4 F (36.3 C) Axillary 72 20 97 % 5' 9" (1.753 m) 147 lb 4.8 oz (66.8 kg)  09/16/16 1945 (!) 166/73 97.7 F (36.5 C) - 71 15 97 % - -  09/16/16 1930 (!) 156/77 - - 64 14 96 % - -  09/16/16 1915 (!) 151/76 - - 68 13 95 % - -  09/16/16 1900 (!) 154/83 - - 76 17 97 % - -  09/16/16 1845 (!) 167/88 97.9 F (36.6 C) - 73 12 100 % - -   As above    Recent Labs  09/15/16 1507 09/16/16 0509  WBC 4.0 4.2  HGB 11.8* 12.1*  HCT 36.4* 37.7*  PLT 134* 147*    Recent Labs  09/15/16 1507 09/16/16 0509  NA 137 137  K 4.0 3.6  CL 105 105  CO2 24 24  GLUCOSE 114* 97  BUN 17 17  CREATININE 1.57* 1.44*  CALCIUM 9.2 9.1    Blood smear review:  None  Pathology: None     Assessment and Plan:  Mr. Zachary Mccarthy is a very nice 76 year old African-American male. 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 He has  not smoked for30 years. Again there might be occupational exposures.  I would have to think that this is more than likely a non-small cell lung cancer. However, if this is a small cell lung cancer, this would certainly make therapeutic interventions more effective.  He clearly will need radiation oncology to be involved. He will need radiation therapy to his lumbar spine. He will need radiation for the CNS metastasis.  He may need to have the CNS/cerebellar met resected. I certainly would not be adverse to this being done.  This is a large intrathoracic primary. I can feel the lymphadenopathy in the left lower neck.  He may require a combination of radiation and chemotherapy for his primary site.  He will also need bisphosphonate therapy for his bones.  I will get a bone scan on him. This can tell us if other bones are involved.  We had a very good prayer session. He and his family have a very strong faith. We talked about our faith. It was nice to be encouraged by such a good family.  We will follow along. I really cannot make any further recommendations until we have the pathology results back.  If, for some reason, the L4 lesion is not adequate for biopsy, then he might need to have the cerebellar met resected or we might be able to get tissue from the cervical adenopathy.  We are going to clearly need enough tissue to run our molecular markers if this is a non-small cell lung cancer.  I answered as many questions as I could from his family. They all seem to understand what is going on. We did not talk about prognosis as of yet. I want to wait until we get the pathology results back before we discuss prognosis.  Lattie Haw, MD  Michaelyn Barter 6:9-10

## 2016-09-17 NOTE — Progress Notes (Signed)
Patient came back from PACU. Patient alert and oriented to self. Patient denies any pain at this time. Will continue to monitor.

## 2016-09-17 NOTE — Anesthesia Postprocedure Evaluation (Signed)
Anesthesia Post Note  Patient: Lamarius Dirr  Procedure(s) Performed: Procedure(s) (LRB): LUMBAR FOUR- LUMBAR FIVE LAMINOTOMY/FORAMINOTOMY, LUMBAR FOUR BIOPSY (Right)  Patient location during evaluation: PACU Anesthesia Type: General Level of consciousness: sedated and patient cooperative Pain management: pain level controlled Vital Signs Assessment: post-procedure vital signs reviewed and stable Respiratory status: spontaneous breathing Cardiovascular status: stable Anesthetic complications: no        Last Vitals:  Vitals:   09/17/16 0700 09/17/16 0948  BP: (!) 150/59 (!) 136/59  Pulse: 79 74  Resp: 18 18  Temp: 36.6 C 36.9 C    Last Pain:  Vitals:   09/17/16 0948  TempSrc: Oral  PainSc: 0-No pain   Pain Goal:                 Nolon Nations

## 2016-09-17 NOTE — Clinical Social Work Note (Signed)
CSW met with pt and family at bedside today. Pt in agreement with SNF and prefers [facility]. CSW assessment to follow. CSW will continue to follow for d/c needs.   Oretha Ellis, [place], [place] Work [phone]

## 2016-09-17 NOTE — Evaluation (Signed)
Occupational Therapy Evaluation Patient Details Name: Zachary Mccarthy MRN: [id number] DOB: 1940/10/16 Today's Date: 09/17/2016    History of Present Illness Pt is a 76 y/o male s/p right L4-5 laminotomy/foraminotomy and L4 vertebral body biopsy. Pt has a past medical history of  Hyperlipidemia; Hypertension; Cataract extraction; and Vascular surgery.   Clinical Impression   PTA Pt independent in ADL/IADL and mobility (with severe decline the past month - requiring assist for ADL from wife and [facility] for mobility). Pt currently mod A for ADL and mobility with RW due to physical and cognitive deficits. Pt demonstrating safety awareness and orientation deficits. Pt seems to be semi-aware of inability to answer questions and with increasing flat affect during session. At this time, OT is recommending continued skilled OT in the acute setting to maximize safety and independence in ADL and functional transfers as well as follow up therapy in a SNF to return to PLOF. This was discussed in length with the Pt's wife [place]. Next session to reinforce back precautions and intro to AE (if cognitive has improved).    Follow Up Recommendations  SNF;Supervision/Assistance - 24 hour    Equipment Recommendations  Other (comment) (defer to next venue)    Recommendations for Other Services       Precautions / Restrictions Precautions Precautions: Fall;Back Precaution Booklet Issued: No Precaution Comments: reviewed back precautions Restrictions Weight Bearing Restrictions: No      Mobility Bed Mobility Overal bed mobility: Needs Assistance Bed Mobility: Supine to Sit     Supine to sit: Min assist     General bed mobility comments: Pt lying diagonally in bed and very low, unable to perform logroll technique, modified as able.   Transfers Overall transfer level: Needs assistance Equipment used: Rolling walker (2 wheeled) Transfers: Sit to/from Stand Sit to Stand: Mod assist         General  transfer comment: Pt with heavy posterior lean with standing, unable to correct with verbal and tactile cues.     Balance Overall balance assessment: Needs assistance Sitting-balance support: Bilateral upper extremity supported Sitting balance-Leahy Scale: Poor Sitting balance - Comments: posterior lean with assist needed to maintain balance   Standing balance support: Bilateral upper extremity supported Standing balance-Leahy Scale: Poor Standing balance comment: using rw and physical assist needed                           ADL either performed or assessed with clinical judgement   ADL Overall ADL's : Needs assistance/impaired     Grooming: Minimal assistance;Sitting;Wash/dry face;Wash/dry Nurse, mental health Details (indicate cue type and reason): in recliner Upper Body Bathing: Moderate assistance;Sitting   Lower Body Bathing: Moderate assistance   Upper Body Dressing : Min guard;Cueing for safety   Lower Body Dressing: Moderate assistance;Sit to/from stand   Toilet Transfer: Moderate assistance;Cueing for safety;Cueing for sequencing;Ambulation;RW Toilet Transfer Details (indicate cue type and reason): heavy posterior lean, Pt unaware Toileting- Clothing Manipulation and Hygiene: Moderate assistance;Sit to/from stand       Functional mobility during ADLs: Moderate assistance;Rolling walker;Cueing for safety;Cueing for sequencing General ADL Comments: Pt and wife educated in back precautions during functional ADL     Vision Patient Visual Report: No change from baseline       Perception     Praxis      Pertinent Vitals/Pain Pain Assessment: Faces Faces Pain Scale: Hurts little more Pain Location: back Pain Descriptors / Indicators: Guarding Pain Intervention(s): Limited activity within patient's  tolerance;Monitored during session;Repositioned     Hand Dominance Right   Extremity/Trunk Assessment Upper Extremity Assessment Upper Extremity Assessment:  Generalized weakness   Lower Extremity Assessment Lower Extremity Assessment: Defer to PT evaluation   Cervical / Trunk Assessment Cervical / Trunk Assessment: Other exceptions Cervical / Trunk Exceptions: s/p back sx; rounded shoulders and forward head   Communication Communication Communication: No difficulties   Cognition Arousal/Alertness: Awake/alert Behavior During Therapy: WFL for tasks assessed/performed Overall Cognitive Status: Impaired/Different from baseline Area of Impairment: Orientation;Following commands;Safety/judgement                 Orientation Level: Disoriented to;Place;Time     Following Commands: Follows one step commands consistently Safety/Judgement: Decreased awareness of safety;Decreased awareness of deficits     General Comments: Spouse reporting that the pt is more confused than usual. States that he does not have any confusion typically.    General Comments  Pt's wife present for entire session and recieved all education    Exercises     Shoulder Instructions      Home Living Family/patient expects to be discharged to:: Private residence Living Arrangements: Spouse/significant other Available Help at Discharge: Family;Available 24 hours/day Type of Home: House Home Access: Stairs to enter [employer] of Steps: 2 Entrance Stairs-Rails: None Home Layout: One level     Bathroom Shower/Tub: Occupational psychologist: Standard Bathroom Accessibility: Yes How Accessible: Accessible via walker Home Equipment: Cane - single point          Prior Functioning/Environment Level of Independence: Independent        Comments: driving, no SPC until about a month ago         OT Problem List: Decreased strength;Decreased range of motion;Decreased activity tolerance;Impaired balance (sitting and/or standing);Decreased cognition;Decreased safety awareness;Decreased knowledge of use of DME or AE;Decreased knowledge of  precautions;Pain      OT Treatment/Interventions: Self-care/ADL training;Energy conservation;DME and/or AE instruction;Therapeutic activities;Cognitive remediation/compensation;Patient/family education;Balance training    OT Goals(Current goals can be found in the care plan section) Acute Rehab OT Goals Patient Stated Goal: not expressed ADL Goals Pt Will Perform Upper Body Bathing: with supervision;sitting Pt Will Perform Lower Body Bathing: with min guard assist;with caregiver independent in assisting;sitting/lateral leans;with adaptive equipment Pt Will Perform Upper Body Dressing: with supervision;sitting Pt Will Perform Lower Body Dressing: with adaptive equipment;with caregiver independent in assisting;sit to/from stand;with min assist Pt Will Transfer to Toilet: with supervision;ambulating (with RW; caregiver independent in assisting) Pt Will Perform Toileting - Clothing Manipulation and hygiene: with supervision;sit to/from stand;with caregiver independent in assisting;with adaptive equipment Additional ADL Goal #1: Pt will recall 3/3 back precautions and maintain during ADL at supervision level Additional ADL Goal #2: Pt will perform bed mobility as precursor to ADL at supervision level maintaining back precautions  OT Frequency: Min 2X/week   Barriers to D/C:            Co-evaluation PT/OT/SLP Co-Evaluation/Treatment: Yes Reason for Co-Treatment: Necessary to address cognition/behavior during functional activity;For patient/therapist safety PT goals addressed during session: Mobility/safety with mobility OT goals addressed during session: ADL's and self-care      AM-PAC PT "6 Clicks" Daily Activity     Outcome Measure Help from another person eating meals?: A Little Help from another person taking care of personal grooming?: A Little Help from another person toileting, which includes using toliet, bedpan, or urinal?: A Lot Help from another person bathing (including  washing, rinsing, drying)?: A Lot Help from another person to  put on and taking off regular upper body clothing?: A Little Help from another person to put on and taking off regular lower body clothing?: A Lot 6 Click Score: 15   End of Session Equipment Utilized During Treatment: Gait belt;Rolling walker Nurse Communication: Mobility status;Precautions (in room administering pain medication)  Activity Tolerance: Patient tolerated treatment well Patient left: in chair;with call bell/phone within reach;with chair alarm set;with nursing/sitter in room;with family/visitor present  OT Visit Diagnosis: Unsteadiness on feet (R26.81);Other abnormalities of gait and mobility (R26.89);Muscle weakness (generalized) (M62.81);Other symptoms and signs involving cognitive function;Pain Pain - Right/Left: Right Pain - part of body: Leg (back)                Time: 1779-3903 OT Time Calculation (min): 46 min Charges:  OT General Charges $OT Visit: 1 Procedure OT Evaluation $OT Eval Moderate Complexity: 1 Procedure OT Treatments $Self Care/Home Management : 8-22 mins G-Codes:     Hulda Humphrey OTR/L [place] 09/17/2016, 1:56 PM

## 2016-09-17 NOTE — Evaluation (Addendum)
Physical Therapy Evaluation Patient Details Name: Zachary Mccarthy MRN: [id number] DOB: Nov 29, 1940 Today's Date: 09/17/2016   History of Present Illness  Pt is a 76 y/o male s/p right L4-5 laminotomy/foraminotomy and L4 vertebral body biopsy. Pt has a past medical history of  Hyperlipidemia; Hypertension; Cataract extraction; and Vascular surgery.  Clinical Impression  Patient is s/p above surgery resulting in the deficits listed below (see PT Problem List). Currently the pt is having difficulty with mobility due to physical and cognitive deficits. With mobility the pt is requiring physical assistance for bed mobility, transfers, and gait. In regard to cognition, the pt was confused throughout the session. He was able to follow single step commands consistently but demonstrates poor safety awareness. Based upon the patient's current mobility, PT is currently recommending SNF for further rehabilitation following acute stay. This was discussed with the patient's spouse. PT will continue to follow and attempt to progress his mobility and overall safety.      Follow Up Recommendations SNF;Supervision/Assistance - 24 hour    Equipment Recommendations   (to be determined)    Recommendations for Other Services       Precautions / Restrictions Precautions Precautions: Fall;Back Precaution Booklet Issued: No Precaution Comments: reviewed back precautions Restrictions Weight Bearing Restrictions: No      Mobility  Bed Mobility Overal bed mobility: Needs Assistance Bed Mobility: Supine to Sit     Supine to sit: Min assist     General bed mobility comments: Pt lying diagonally in bed and very low, unable to perform logroll technique, modified as able.   Transfers Overall transfer level: Needs assistance Equipment used: Rolling walker (2 wheeled) Transfers: Sit to/from Stand Sit to Stand: Mod assist         General transfer comment: Pt with heavy posterior lean with standing, unable to  correct with verbal and tactile cues.   Ambulation/Gait Ambulation/Gait assistance: Mod assist Ambulation Distance (Feet): 20 Feet Assistive device: Rolling walker (2 wheeled) Gait Pattern/deviations: Step-through pattern;Decreased stride length Gait velocity: slow sequence   General Gait Details: Needing assist for standing balance with continuing posterior lean. Assist needing to advance rw. Second assist provided with IV pole for safety.   Stairs            Wheelchair Mobility    Modified Rankin (Stroke Patients Only)       Balance Overall balance assessment: Needs assistance Sitting-balance support: Bilateral upper extremity supported Sitting balance-Leahy Scale: Poor Sitting balance - Comments: posterior lean with assist needed to maintain balance   Standing balance support: Bilateral upper extremity supported Standing balance-Leahy Scale: Poor Standing balance comment: using rw and physical assist needed                             Pertinent Vitals/Pain Pain Assessment: Faces Faces Pain Scale: Hurts little more Pain Location: back Pain Descriptors / Indicators: Guarding Pain Intervention(s): Limited activity within patient's tolerance;Monitored during session;Repositioned    Home Living Family/patient expects to be discharged to:: Private residence Living Arrangements: Spouse/significant other Available Help at Discharge: Family;Available 24 hours/day Type of Home: House Home Access: Stairs to enter Entrance Stairs-Rails: None Entrance Stairs-Number of Steps: 2 Home Layout: One level Home Equipment: Cane - single point      Prior Function Level of Independence: Independent         Comments: driving,      Hand Dominance   Dominant Hand: Right    Extremity/Trunk Assessment  Upper Extremity Assessment Upper Extremity Assessment: Defer to OT evaluation    Lower Extremity Assessment Lower Extremity Assessment: Generalized  weakness       Communication   Communication: No difficulties  Cognition Arousal/Alertness: Awake/alert Behavior During Therapy: WFL for tasks assessed/performed Overall Cognitive Status: Impaired/Different from baseline Area of Impairment: Orientation;Following commands;Safety/judgement                 Orientation Level: Disoriented to;Place;Time     Following Commands: Follows one step commands consistently Safety/Judgement: Decreased awareness of safety;Decreased awareness of deficits     General Comments: Spouse reporting that the pt is more confused than usual. States that he does not have any confusion typically.       General Comments      Exercises     Assessment/Plan    PT Assessment Patient needs continued PT services  PT Problem List Decreased strength;Decreased activity tolerance;Decreased balance;Decreased mobility;Decreased cognition;Decreased knowledge of use of DME;Decreased safety awareness;Decreased knowledge of precautions       PT Treatment Interventions DME instruction;Gait training;Stair training;Functional mobility training;Therapeutic activities;Therapeutic exercise;Balance training;Neuromuscular re-education;Patient/family education;Cognitive remediation    PT Goals (Current goals can be found in the Care Plan section)  Acute Rehab PT Goals Patient Stated Goal: not expressed PT Goal Formulation: With patient/family Time For Goal Achievement: 10/01/16 Potential to Achieve Goals: Good    Frequency Min 5X/week   Barriers to discharge        Co-evaluation PT/OT/SLP Co-Evaluation/Treatment: Yes Reason for Co-Treatment: Necessary to address cognition/behavior during functional activity;For patient/therapist safety PT goals addressed during session: Mobility/safety with mobility         AM-PAC PT "6 Clicks" Daily Activity  Outcome Measure Difficulty turning over in bed (including adjusting bedclothes, sheets and blankets)?: A  Little Difficulty moving from lying on back to sitting on the side of the bed? : A Lot Difficulty sitting down on and standing up from a chair with arms (e.g., wheelchair, bedside commode, etc,.)?: A Lot Help needed moving to and from a bed to chair (including a wheelchair)?: A Lot Help needed walking in hospital room?: A Lot Help needed climbing 3-5 steps with a railing? : A Lot 6 Click Score: 13    End of Session Equipment Utilized During Treatment: Gait belt Activity Tolerance: Patient tolerated treatment well Patient left: in chair;with call bell/phone within reach;with chair alarm set;with family/visitor present Nurse Communication: Mobility status PT Visit Diagnosis: Unsteadiness on feet (R26.81);Muscle weakness (generalized) (M62.81);Difficulty in walking, not elsewhere classified (R26.2)    Time: 1324-4010 PT Time Calculation (min) (ACUTE ONLY): 26 min   Charges:   PT Evaluation $PT Eval Moderate Complexity: 1 Procedure     PT G Codes:        Cassell Clement, PT, CSCS Pager [phone] Office [phone]   Linard Millers 09/17/2016, 12:38 PM

## 2016-09-17 NOTE — Progress Notes (Signed)
Pt sitting up in chair. Wife in a chair by his side. Call bell within reach. No noted distress. Will monitor.

## 2016-09-18 ENCOUNTER — Ambulatory Visit
Admit: 2016-09-18 | Discharge: 2016-09-18 | Disposition: A | Discharge status: Home / Self Care | Payer: Medicare Other | Attending: Urology | Admitting: Urology

## 2016-09-18 ENCOUNTER — Inpatient Hospital Stay (HOSPITAL_COMMUNITY): Payer: Medicare Other

## 2016-09-18 ENCOUNTER — Institutional Professional Consult (permissible substitution): Payer: Medicare Other | Admitting: Radiation Oncology

## 2016-09-18 DIAGNOSIS — C7931 Secondary malignant neoplasm of brain: Secondary | ICD-10-CM

## 2016-09-18 DIAGNOSIS — M4857XS Collapsed vertebra, not elsewhere classified, lumbosacral region, sequela of fracture: Secondary | ICD-10-CM

## 2016-09-18 LAB — COMPREHENSIVE METABOLIC PANEL
ALBUMIN: 2.4 g/dL — AB (ref 3.5–5.0)
ALT: 8 U/L — ABNORMAL LOW (ref 17–63)
ANION GAP: 10 (ref 5–15)
AST: 25 U/L (ref 15–41)
Alkaline Phosphatase: 32 U/L — ABNORMAL LOW (ref 38–126)
BUN: 31 mg/dL — ABNORMAL HIGH (ref 6–20)
CHLORIDE: 103 mmol/L (ref 101–111)
CO2: 20 mmol/L — AB (ref 22–32)
Calcium: 8.3 mg/dL — ABNORMAL LOW (ref 8.9–10.3)
Creatinine, Ser: 1.64 mg/dL — ABNORMAL HIGH (ref 0.61–1.24)
GFR calc non Af Amer: 39 mL/min — ABNORMAL LOW (ref >60)
GFR, EST AFRICAN AMERICAN: 45 mL/min — AB (ref >60)
GLUCOSE: 152 mg/dL — AB (ref 65–99)
POTASSIUM: 4.2 mmol/L (ref 3.5–5.1)
SODIUM: 133 mmol/L — AB (ref 135–145)
Total Bilirubin: 0.5 mg/dL (ref 0.3–1.2)
Total Protein: 5.7 g/dL — ABNORMAL LOW (ref 6.5–8.1)

## 2016-09-18 LAB — CBC WITH DIFFERENTIAL/PLATELET
BASOS PCT: 0 %
Basophils Absolute: 0 10*3/uL (ref 0.0–0.1)
EOS PCT: 0 %
Eosinophils Absolute: 0 10*3/uL (ref 0.0–0.7)
HCT: 31.7 % — ABNORMAL LOW (ref 39.0–52.0)
HEMOGLOBIN: 10.4 g/dL — AB (ref 13.0–17.0)
LYMPHS ABS: 0.7 10*3/uL (ref 0.7–4.0)
Lymphocytes Relative: 7 %
MCH: 26.9 pg (ref 26.0–34.0)
MCHC: 32.8 g/dL (ref 30.0–36.0)
MCV: 81.9 fL (ref 78.0–100.0)
MONOS PCT: 3 %
Monocytes Absolute: 0.3 10*3/uL (ref 0.1–1.0)
NEUTROS PCT: 90 %
Neutro Abs: 9.1 10*3/uL — ABNORMAL HIGH (ref 1.7–7.7)
PLATELETS: 137 10*3/uL — AB (ref 150–400)
RBC: 3.87 MIL/uL — ABNORMAL LOW (ref 4.22–5.81)
RDW: 15.1 % (ref 11.5–15.5)
WBC: 10.1 10*3/uL (ref 4.0–10.5)

## 2016-09-18 LAB — LACTATE DEHYDROGENASE: LDH: 293 U/L — ABNORMAL HIGH (ref 98–192)

## 2016-09-18 MED ORDER — LORAZEPAM 2 MG/ML IJ SOLN
1.0000 mg | Freq: Once | INTRAMUSCULAR | Status: AC
  Administered 2016-09-18: 2 mg via INTRAVENOUS
  Filled 2016-09-18: qty 1

## 2016-09-18 MED ORDER — GADOBENATE DIMEGLUMINE 529 MG/ML IV SOLN
14.0000 mL | Freq: Once | INTRAVENOUS | Status: AC | PRN
  Administered 2016-09-18: 14 mL via INTRAVENOUS

## 2016-09-18 MED ORDER — PANTOPRAZOLE SODIUM 40 MG PO TBEC
40.0000 mg | DELAYED_RELEASE_TABLET | Freq: Every day | ORAL | Status: DC
  Administered 2016-09-19 – 2016-09-28 (×10): 40 mg via ORAL
  Filled 2016-09-18 (×11): qty 1

## 2016-09-18 MED ORDER — TECHNETIUM TC 99M MEDRONATE IV KIT
25.0000 | PACK | Freq: Once | INTRAVENOUS | Status: AC | PRN
  Administered 2016-09-18: 25 via INTRAVENOUS

## 2016-09-18 NOTE — Progress Notes (Signed)
Patient leaving floor to Nuclear Medicine. Left unit via bed, no signs of distress.

## 2016-09-18 NOTE — Progress Notes (Signed)
PT Cancellation Note  Patient Details Name: Zachary Mccarthy MRN: [id number] DOB: 12-Aug-1940   Cancelled Treatment:    Reason Eval/Treat Not Completed: Other (comment). Pt currently having a meeting regarding radiology/oncology. PT will continue to f/u with pt as available.    [place] 09/18/2016, 4:53 PM

## 2016-09-18 NOTE — Progress Notes (Signed)
Pt came down for SRS/Brain Lab protocol MRI.  Pt was very confused, could not follow any commands, was grasping things in the air with his hands, picking at his sheets, pulled his IV out while here.  This protocol is very important that the patient lay absolutely still as surgery is planned on these images.  Charge nurse was contacted and said she would get ahold of the surgery service as he is supposed to get his procedure done tomorrow.

## 2016-09-18 NOTE — NC FL2 (Signed)
[place] LEVEL OF CARE SCREENING TOOL     IDENTIFICATION  Patient Name: Zachary Mccarthy Birthdate: [birth year] Sex: male Admission Date (Current Location): 09/15/2016  [facility] and [place] Number:  Herbalist and Address:  [facility] [place], [place] [address] [place], [place] [address]      Provider Number: [id number]  Attending Physician Name and Address:  [name], MD  Relative Name and Phone Number:       Current Level of Care: Hospital Recommended Level of Care: [place] Prior Approval Number:    Date Approved/Denied:   PASRR Number: [id number]  Discharge Plan: SNF    Current Diagnoses: Patient Active Problem List   Diagnosis Date Noted  . Lung nodule   . 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 Essential hypertension 09/15/2016  . Hyperlipidemia 09/15/2016  . Chronic kidney disease 09/15/2016  . Preop cardiovascular exam   . Confusion   . 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 Bladder mass 11/10/2013    Orientation RESPIRATION BLADDER Height & Weight     Self, Place  Normal Incontinent Weight: 147 lb 4.8 oz (66.8 kg) Height:  '5\' 9"'$  (175.3 cm)  BEHAVIORAL SYMPTOMS/MOOD NEUROLOGICAL BOWEL NUTRITION STATUS      Continent Diet (Regular diet; thin fluids)  AMBULATORY STATUS COMMUNICATION OF NEEDS Skin   Limited Assist Verbally Surgical wounds (09/16/16 Closed incision on back)                       Personal Care Assistance Level of Assistance  Bathing, Feeding, Dressing Bathing Assistance: Limited assistance Feeding assistance: Independent Dressing Assistance: Limited assistance     Functional Limitations Info  Sight, Hearing, Speech Sight Info: Adequate Hearing Info: Adequate Speech Info: Adequate    SPECIAL CARE FACTORS FREQUENCY  PT (By licensed PT), OT (By licensed OT)     PT Frequency: 5x OT Frequency: 5x             Contractures Contractures Info: Not present    Additional Factors Info  Code Status, Allergies Code Status Info: Full Allergies Info: No known allergies           Current Medications (09/18/2016):  This is the current hospital active medication list Current Facility-Administered Medications  Medication Dose Route Frequency Provider Last Rate Last Dose  . 0.9 %  sodium chloride infusion   Intravenous Continuous [name], MD 75 mL/hr at 09/18/16 0136    . 0.9 %  sodium chloride infusion  250 mL Intravenous Continuous [name], MD      . amLODipine (NORVASC) tablet 10 mg  10 mg Oral Daily [name], MD   10 mg at 09/17/16 1014  . bisacodyl (DULCOLAX) suppository 10 mg  10 mg Rectal Daily PRN [name], MD      . carvedilol (COREG) tablet 25 mg  25 mg Oral BID WC [name], MD   25 mg at 09/18/16 0855  . dexamethasone (DECADRON) injection 4 mg  4 mg Intravenous Q6H [name], MD   4 mg at 09/18/16 0520  . docusate sodium (COLACE) capsule 100 mg  100 mg Oral BID [name], MD   100 mg at 09/17/16 2147  . ezetimibe (ZETIA) tablet 10 mg  10 mg Oral Daily [name], MD   10 mg at 09/17/16 1014  . feeding supplement (ENSURE ENLIVE) (ENSURE ENLIVE) liquid 237 mL  237 mL Oral TID BM  [name], MD   237 mL at 09/17/16 1959  . gabapentin (NEURONTIN) capsule 300 mg  300 mg Oral TID [name], MD   300 mg at 09/17/16 2147  . hydrALAZINE (APRESOLINE) injection 5-10 mg  5-10 mg Intravenous Q4H PRN Waldemar Dickens, MD      . HYDROcodone-acetaminophen (NORCO/VICODIN) 5-325 MG per tablet 1-2 tablet  1-2 tablet Oral Q4H PRN [name], MD      . lactated ringers infusion   Intravenous Continuous Rondel Jumbo, PA-C 50 mL/hr at 09/15/16 1813    . menthol-cetylpyridinium (CEPACOL) lozenge 3 mg  1 lozenge Oral PRN [name], MD       Or  . phenol (CHLORASEPTIC) mouth spray 1 spray  1 spray Mouth/Throat PRN  [name], MD      . methocarbamol (ROBAXIN) tablet 500 mg  500 mg Oral Q6H PRN [name], MD       Or  . methocarbamol (ROBAXIN) 500 mg in dextrose 5 % 50 mL IVPB  500 mg Intravenous Q6H PRN [name], MD      . morphine 4 MG/ML injection 1-2 mg  1-2 mg Intravenous Q3H PRN Kristeen Miss, MD   2 mg at 09/17/16 1957  . ondansetron (ZOFRAN) tablet 4 mg  4 mg Oral Q6H PRN [name], MD       Or  . ondansetron (ZOFRAN) injection 4 mg  4 mg Intravenous Q6H PRN [name], MD      . pamidronate (AREDIA) 90 mg in sodium chloride 0.9 % 500 mL IVPB  90 mg Intravenous Once Volanda Napoleon, MD      . pantoprazole (PROTONIX) injection 40 mg  40 mg Intravenous QHS [name], MD   40 mg at 09/17/16 2147  . polyethylene glycol (MIRALAX / GLYCOLAX) packet 17 g  17 g Oral Daily PRN [name], MD      . senna (SENOKOT) tablet 8.6 mg  1 tablet Oral BID [name], MD   8.6 mg at 09/17/16 2147  . simvastatin (ZOCOR) tablet 20 mg  20 mg Oral q1800 [name], MD   20 mg at 09/17/16 1807  . sodium chloride flush (NS) 0.9 % injection 3 mL  3 mL Intravenous Q12H [name], MD   3 mL at 09/17/16 1000  . sodium chloride flush (NS) 0.9 % injection 3 mL  3 mL Intravenous PRN [name], MD      . sodium phosphate (FLEET) 7-19 GM/118ML enema 1 enema  1 enema Rectal Once PRN [name], MD         Discharge Medications: Please see discharge summary for a list of discharge medications.  Relevant Imaging Results:  Relevant Lab Results:   Additional Information SSN: [id number]  Truitt Merle, LCSW

## 2016-09-18 NOTE — Progress Notes (Signed)
RN with patient for second attempt for MRI, ativan with little effect on patient, MRI completed although scans incomplete d/t patient constantly moving.  Discussion with MRI tech states pt could benefit from scan with anesthesia if needed prior to scheduled SRS tomorrow at [facility] When returned from MRI, patient too drowsy to swallow oral meds.  Continue to monitor patient.

## 2016-09-18 NOTE — Consult Note (Signed)
Radiation Oncology         [phone]) [phone] ________________________________  Initial inpatient Consultation  Name: Zachary Mccarthy MRN: [id number]  Date: 09/15/2016  DOB: 19-Mar-1941  DT:OIZT, Christianne Borrow, MD  No ref. provider found   REFERRING PHYSICIAN: No ref. provider found  DIAGNOSIS: The primary encounter diagnosis was Pathological compression fracture of vertebra, initial encounter ([place]). Diagnoses of Preop cardiovascular exam, Confusion, Lung nodule, Surgery, elective, Brain metastasis ([place]), Brain tumor [facility]), Lung cancer metastatic to bone [facility]), and Brain lesion were also pertinent to this visit.  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      ICD-9-CM ICD-10-CM   1. Pathological compression fracture of vertebra, initial encounter (HCC) 733.13 M48.50XA pamidronate (AREDIA) 90 mg in sodium chloride 0.9 % 500 mL IVPB  2. Preop cardiovascular exam V72.81 Z01.810 DG Chest 2 View     DG Chest 2 View  3. Confusion 298.9 R41.0   4. Lung nodule 793.11 R91.1   5. Surgery, elective V50.9 Z41.9 DG Lumbar Spine 2-3 Views     DG Lumbar Spine 2-3 Views  6. Brain metastasis (HCC) 198.3 C79.31 CANCELED: MR BRAIN W WO CONTRAST     CANCELED: MR BRAIN W WO CONTRAST     CANCELED: MR BRAIN W CONTRAST     CANCELED: MR BRAIN W CONTRAST  7. Brain tumor ([place]) 239.6 D49.6 CANCELED: MR BRAIN W WO CONTRAST     CANCELED: MR BRAIN W WO CONTRAST  8. Lung cancer metastatic to bone (HCC) 162.9 C34.90 NM Bone Scan Whole Body   198.5 C79.51 NM Bone Scan Whole Body  9. Brain lesion 348.89 G93.9 MR BRAIN W WO CONTRAST     MR BRAIN W WO CONTRAST    HISTORY OF PRESENT ILLNESS: Zachary Mccarthy is a 76 y.o. male seen at the request of Dr. Kathyrn Sheriff for evaluation of newly diagnosed metastatic lung cancer with brain metastasis and pathologic L4 fracture from bony metastasis.  The patient was admitted to the hospital on 09/15/2016 with complaints of low back pain and  right-sided leg pain and weakness ongoing for the past 5 months. He had not had any precipitating injury. He also reported a several month history of generalized fatigue and approximately 20 pound weight loss. MRI of the lumbar spine dated or 05/07/2017 demonstrated primary pathology at L4 where there was a compression fracture with greater than 50% loss of height in the vertebral body. With the patient's constitutional symptoms concerning for a pathologic fracture, the patient underwent a CT of the head and chest on 09/16/2016.   CT Head showed small, ill-defined, right posterior parietal intra-axial hyperdensity with an additional 3.5 cm hypodense masslike abnormality in the right cerebellar hemisphere causing mass effect on the fourth ventricle. There are more confluent areas of hypodensity seen in the left frontal white matter felt likely representing transependymal spread of CSF. There was postobstructive dilation on the lateral and third ventricles from mass effect of fourth ventricle.  CT chest demonstrated a large right perihilar and infrahilar masslike abnormality measuring 11 x 7.3 x 5.8 cm with mediastinal adenopathy, concerning for bronchogenic carcinoma with metastatic adenopathy. There also appear to be cervical necrotic adenopathy at the base of the neck on the left. There are also multiple calcified and noncalcified oh centimeter pulmonary nodules noted predominantly within the right lung.  The patient underwent a right L4-L5 decompression with biopsy of L4 vertebral body on 09/16/2016. 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  A bone scan performed earlier today demonstrates activity at the known L4 pathologic body fracture but no indication of osseous metastasis elsewhere.  3T SRS protocol MRI brain has been ordered but as of yet, has not been performed.  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 He is status post TURBT with negative surveillance cystoscopy on 06/03/2016 and has not had recurrence since 2015. He is followed by Dr. Alverda Skeans in urology.    He was referred for consultation today to discuss the role for preoperative stereotactic radiosurgery [facility]) in the treatment of the large right cerebellar mass which is causing mass effect with resultant hydrocephalus.  PREVIOUS RADIATION THERAPY: No  PAST MEDICAL HISTORY:   Chronic kidney disease, stage III with baseline creatinine of 1.5-1.9. 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 Compression fracture of spine ([place]) 08/2016  . Hyperlipidemia   . Hypertension       PAST SURGICAL HISTORY: Past Surgical History:  Procedure Laterality Date  . CATARACT EXTRACTION    . HERNIA REPAIR    . LUMBAR LAMINECTOMY/DECOMPRESSION MICRODISCECTOMY Right 09/16/2016   Procedure: LUMBAR FOUR- LUMBAR FIVE LAMINOTOMY/FORAMINOTOMY, LUMBAR FOUR BIOPSY;  Surgeon: [name], MD;  Location: [place];  Service: Neurosurgery;  Laterality: Right;  Marland Kitchen VASCULAR SURGERY      FAMILY HISTORY:  Family History  Problem Relation Age of Onset  . Family history unknown: Yes    SOCIAL HISTORY:  Social History   Social History  . Marital status: Married    Spouse name: N/A  . Number of children: N/A  . Years of education: N/A   Occupational History  . Not on file.   Social History Main Topics  . Smoking status: Former Research scientist (life sciences)  . Smokeless tobacco: Never Used     Comment: quit smoking in the 80's   . Alcohol use No  . Drug use: No  . Sexual activity: Not on file   Other Topics Concern  . Not on file   Social History Narrative  . No narrative on file    ALLERGIES: No known allergies  MEDICATIONS:  Current Facility-Administered Medications  Medication Dose Route Frequency Provider Last Rate  Last Dose  . 0.9 %  sodium chloride infusion   Intravenous Continuous [name], MD 75 mL/hr at 09/18/16 0136    . 0.9 %  sodium chloride infusion  250 mL Intravenous Continuous [name], MD      . amLODipine (NORVASC) tablet 10 mg  10 mg Oral Daily [name], MD   10 mg at 09/18/16 1022  . bisacodyl (DULCOLAX) suppository 10 mg  10 mg Rectal Daily PRN [name], MD      . carvedilol (COREG) tablet 25 mg  25 mg Oral BID WC [name], MD   25 mg at 09/18/16 0855  . dexamethasone (DECADRON) injection 4 mg  4 mg Intravenous Q6H [name], MD   4 mg at 09/18/16 1212  . docusate sodium (COLACE) capsule 100 mg  100 mg Oral BID [name], MD   100 mg at 09/18/16 1022  . ezetimibe (ZETIA) tablet 10 mg  10 mg Oral Daily [name], MD   10 mg at 09/18/16 1022  . feeding supplement (ENSURE ENLIVE) (ENSURE ENLIVE) liquid 237 mL  237 mL Oral TID BM [name], MD  237 mL at 09/18/16 1022  . gabapentin (NEURONTIN) capsule 300 mg  300 mg Oral TID [name], MD   300 mg at 09/18/16 1022  . hydrALAZINE (APRESOLINE) injection 5-10 mg  5-10 mg Intravenous Q4H PRN Waldemar Dickens, MD      . HYDROcodone-acetaminophen (NORCO/VICODIN) 5-325 MG per tablet 1-2 tablet  1-2 tablet Oral Q4H PRN [name], MD      . lactated ringers infusion   Intravenous Continuous Rondel Jumbo, PA-C 50 mL/hr at 09/15/16 1813    . menthol-cetylpyridinium (CEPACOL) lozenge 3 mg  1 lozenge Oral PRN [name], MD       Or  . phenol (CHLORASEPTIC) mouth spray 1 spray  1 spray Mouth/Throat PRN [name], MD      . methocarbamol (ROBAXIN) tablet 500 mg  500 mg Oral Q6H PRN [name], MD       Or  . methocarbamol (ROBAXIN) 500 mg in dextrose 5 % 50 mL IVPB  500 mg Intravenous Q6H PRN [name], MD      . morphine 4 MG/ML injection 1-2 mg  1-2 mg Intravenous Q3H PRN Kristeen Miss, MD   2 mg at 09/17/16 1957  . ondansetron  (ZOFRAN) tablet 4 mg  4 mg Oral Q6H PRN [name], MD       Or  . ondansetron (ZOFRAN) injection 4 mg  4 mg Intravenous Q6H PRN [name], MD      . pamidronate (AREDIA) 90 mg in sodium chloride 0.9 % 500 mL IVPB  90 mg Intravenous Once Volanda Napoleon, MD      . pantoprazole (PROTONIX) EC tablet 40 mg  40 mg Oral QHS Romona Curls, RPH      . polyethylene glycol (MIRALAX / GLYCOLAX) packet 17 g  17 g Oral Daily PRN [name], MD      . senna (SENOKOT) tablet 8.6 mg  1 tablet Oral BID [name], MD   8.6 mg at 09/18/16 1022  . simvastatin (ZOCOR) tablet 20 mg  20 mg Oral q1800 [name], MD   20 mg at 09/17/16 1807  . sodium chloride flush (NS) 0.9 % injection 3 mL  3 mL Intravenous Q12H [name], MD   3 mL at 09/18/16 1029  . sodium chloride flush (NS) 0.9 % injection 3 mL  3 mL Intravenous PRN [name], MD      . sodium phosphate (FLEET) 7-19 GM/118ML enema 1 enema  1 enema Rectal Once PRN [name], MD        REVIEW OF SYSTEMS:  On review of systems, the patient reports that he is doing well overall. He denies any chest pain, shortness of breath, cough, fevers, chills, or night sweats.  He has had unintended weight change with a loss of 20 lbs over the past several months. He denies any bowel or bladder disturbances, and denies abdominal pain, nausea or vomiting. He had significant low back and right lower leg pain and weakness ongoing for the past 5 months and progressively worsening on admission.  His reports that his pain is much improved since his decompression procedure on 09/16/16.  He denies other new musculoskeletal or joint aches or pains. His wife reports that he has had some confusion and dizziness intermittently for 1 week prior to admission.  The confusion seems to have worsened today.  A complete review of systems is obtained and is otherwise negative.    PHYSICAL EXAM:  Wt Readings from Last  3 Encounters:  09/16/16  147 lb 4.8 oz (66.8 kg)   Temp Readings from Last 3 Encounters:  09/18/16 98.6 F (37 C) (Oral)   BP Readings from Last 3 Encounters:  09/18/16 (!) 144/72   Pulse Readings from Last 3 Encounters:  09/18/16 87   Pain Assessment Pain Score: 0-No pain/10  In general this is a well appearing african [place] male in no acute distress. He is resting comfortably in the bed.  He is alert and oriented to person and place but not to time or purpose of hosiptalization. He does not recall having had any procedure on his back and instead is focused more on a soft tissue lipoma on the left calf which he reports has been present since he was in his 77s.  He is very pleasant and appropriate throughout the examination. HEENT reveals that the patient is normocephalic, atraumatic. EOMs are intact. PERRLA. Skin is intact without any evidence of gross lesions. Cardiovascular exam reveals a regular rate and rhythm, no clicks rubs or murmurs are auscultated. Chest is clear to auscultation bilaterally. Lymphatic assessment is performed and does reveal palpable lymphadenopathy in the left lower cervical region at the base of the neck.  No palpable supraclavicular or axillary lymphadenopathy.  Abdomen has active bowel sounds in all quadrants and is intact. The abdomen is soft, non tender, non distended. Lower extremities are negative for pretibial pitting edema, deep calf tenderness, cyanosis or clubbing.  KPS = 80  100 - Normal; no complaints; no evidence of disease. 90   - Able to carry on normal activity; minor signs or symptoms of disease. 80   - Normal activity with effort; some signs or symptoms of disease. 100   - Cares for self; unable to carry on normal activity or to do active work. 60   - Requires occasional assistance, but is able to care for most of his personal needs. 50   - Requires considerable assistance and frequent medical care. 80   - Disabled; requires special care and assistance. 24   -  Severely disabled; hospital admission is indicated although death not imminent. 24   - Very sick; hospital admission necessary; active supportive treatment necessary. 10   - Moribund; fatal processes progressing rapidly. 0     - Dead  Karnofsky DA, Abelmann [place], Craver LS and Burchenal [facility] [phone]) The use of the nitrogen mustards in the palliative treatment of carcinoma: with particular reference to bronchogenic carcinoma Cancer 1 634-56  LABORATORY DATA:  Lab Results  Component Value Date   WBC 10.1 09/18/2016   HGB 10.4 (L) 09/18/2016   HCT 31.7 (L) 09/18/2016   MCV 81.9 09/18/2016   PLT 137 (L) 09/18/2016   Lab Results  Component Value Date   NA 133 (L) 09/18/2016   K 4.2 09/18/2016   CL 103 09/18/2016   CO2 20 (L) 09/18/2016   Lab Results  Component Value Date   ALT 8 (L) 09/18/2016   AST 25 09/18/2016   ALKPHOS 32 (L) 09/18/2016   BILITOT 0.5 09/18/2016     RADIOGRAPHY: Dg Chest 2 View  Result Date: 09/15/2016 CLINICAL DATA:  Preoperative respiratory evaluation. EXAM: CHEST  2 VIEW COMPARISON:  None. FINDINGS: The cardio pericardial silhouette is enlarged. Scattered nodules right lung may represent granulomata in there appears to be calcified lymph nodes in the right hilum. There is right base collapse/consolidation with small right pleural effusion. The visualized bony structures of the thorax are intact. IMPRESSION: 1. Multiple right pulmonary  nodules, likely granulomata, but CT chest without contrast recommended to confirm. 2. Right base collapse/consolidation with small right pleural effusion. 3. Enlargement of the cardiopericardial silhouette, likely cardiomegaly although pericardial effusion not excluded. Electronically Signed   By: Misty Stanley M.D.   On: 09/15/2016 20:32   Dg Lumbar Spine 2-3 Views  Result Date: 09/16/2016 CLINICAL DATA:  L4-5 laminectomy/foraminectomy. EXAM: LUMBAR SPINE - 2-3 VIEW; DG C-ARM 61-120 MIN COMPARISON:  MRI lumbar spine 08/28/2016 FINDINGS:  Two spot fluoroscopic images of the lumbar spine demonstrate a surgical instrument over the posterior elements at the L4-5 level. Known L4 compression fracture. Mild spondylosis of the spine. Disc space narrowing at the L5-S1 level. Surgical clips are present anterior to the spine likely from previous vascular surgery. IMPRESSION: Mild spondylosis of the lumbar spine with disc disease at the L5-S1 level. Known L4 compression fracture with surgical instrument over the posterior elements at the L4-5 level. Electronically Signed   By: Marin Olp M.D.   On: 09/16/2016 18:54   Ct Head Wo Contrast  Result Date: 09/16/2016 CLINICAL DATA:  Pulmonary nodules, confusion EXAM: CT HEAD WITHOUT CONTRAST TECHNIQUE: Contiguous axial images were obtained from the base of the skull through the vertex without intravenous contrast. COMPARISON:  Same day chest CT FINDINGS: The study is compromised by motion artifacts. Brain: There is an ill-defined small right posterior parietal intra-axial hyperdensity seen, series 3, image 20 with an approximate 3.5 cm hypodense masslike abnormality in the right cerebellar hemisphere causing positive mass effect on the fourth ventricle. Findings are concerning for metastatic disease and edema given recent findings within the chest CT. More confluent areas of hypodensity are seen in the left frontal white matter likely representing transependymal spread of CSF. No extra-axial fluid collections are identified. Postobstructive dilatation on the lateral and third ventricles from mass effect on the fourth ventricle is noted. Vascular: No hyperdense vessel or unexpected calcification. Skull: Negative for focal lesions.  No fracture. Sinuses/Orbits: Limited by motion artifacts.  No acute finding. Other: None IMPRESSION: Intra-axial ill-defined hyperdensity in the posterior right parietal lobe may represent a small hemorrhagic metastasis. An additional 3.5 cm hypodense mass with adjacent edema in the  right cerebellar hemisphere with positive mass effect on the fourth ventricle is also noted. These in conjunction CT chest findings are concerning for metastatic disease. MRI may help for further correlation. Postobstructive dilatation of the lateral third ventricles secondary to mass-effect of the fourth ventricle with transependymal spread of CSF in the left frontal lobe. Critical Value/emergent results were called by telephone at the time of interpretation on 09/16/2016 at 11:29 pm to Nurse Elita Boone, who verbally acknowledged these results and will be contacting the oncall Neurosurgeon immediately. Electronically Signed   By: Ashley Royalty M.D.   On: 09/16/2016 23:29   Ct Chest Wo Contrast  Result Date: 09/16/2016 CLINICAL DATA:  Confusion, pulmonary nodules EXAM: CT CHEST WITHOUT CONTRAST TECHNIQUE: Multidetector CT imaging of the chest was performed following the standard protocol without IV contrast. COMPARISON:  None. FINDINGS: Cardiovascular: Cardiomegaly with small pericardial effusion and coronary arteriosclerosis. There is aortic atherosclerosis with aneurysmal dilatation of the ascending aorta up to 4.2 cm. There is dilatation of the main pulmonary artery to 3.9 cm consistent with a component of pulmonary hypertension. Atherosclerosis in the proximal great vessels is identified. Mediastinum/Nodes: Necrotic appearing cervical adenopathy is seen at the base of the neck on the left with central areas of hypodensity. Prevascular adenopathy measuring up to 1.5 cm short axis Left lower  paratracheal 1.7 cm short axis lymphadenopathy is noted. The trachea is patent. The left mainstem bronchus is patent. There is narrowing of the right mainstem bronchus and bronchi to the right middle and lower lobes secondary to a mass about the right hilum described below. Lungs/Pleura: There is a large masslike abnormality in the infrahilar right hemithorax abutting the right heart border and measuring roughly 11 x 7.3  x 5.8 cm in AP by transverse by CC dimension. Exact margins are difficult to distinguish given lack of IV contrast and also from postobstructive changes to the right middle and lower lobes. Additionally there are calcified nodules in the right upper, middle and lower lobes consistent granulomas. Right upper lobe 4.1 x 3.1 mm noncalcified nodule, series 5, image 31, oblong 8.1 x 2.9 mm nodule series 5, image 35, posterior segment right upper lobe 5.3 mm nodule series 5, image 70, superior segment right lower lobe series 5, image 83 4.6 mm nodules are also seen potentially representing metastatic nodules or noncalcified granulomas in light of the above findings. Small right pleural effusion is present. Upper Abdomen: Calcified granuloma in the right middle lobe. Indeterminate 12 mm hypodense lesion in the left hepatic lobe series 4, image 165 and the right hepatic dome mm hypodensity, series 4, image 129 are noted. Mild thickening of the adrenal glands. Aortic and branch vessel atherosclerosis is identified within the upper abdomen. There is mild fullness of both renal collecting systems. Musculoskeletal: No suspicious lytic or blastic disease. IMPRESSION: 1. Large right perihilar and infrahilar masslike abnormality measuring 11 x 7.3 x 5.8 cm with mediastinal adenopathy. Associated small right pleural effusion with postobstructive consolidations in the right lower lobe. Findings are concerning for bronchogenic carcinoma with metastatic adenopathy. There appears be cervical necrotic adenopathy at the base of the neck on the left for which CT or MRI with IV contrast is also recommended for better assessment. 2. Calcified and noncalcified subcentimeter pulmonary nodules are noted predominantly within the right lung. A calcified nodules may simply reflect granulomas however the noncalcified nodules cannot exclude metastatic pulmonary nodules. 3. Indeterminate hypodense lesions in the liver measuring 7 mm on the right and  12 mm on the left. 4. Aortic atherosclerosis.  Coronary arteriosclerosis. 5. 4.2 cm ascending aortic aneurysm. Recommend annual imaging followup by CTA or MRA. This recommendation follows 2010 ACCF/AHA/AATS/ACR/ASA/SCA/SCAI/SIR/STS/SVM Guidelines for the Diagnosis and Management of Patients with Thoracic Aortic Disease. Circulation. 2010; 121: T245-Y099 Electronically Signed   By: Ashley Royalty M.D.   On: 09/16/2016 23:11   Nm Bone Scan Whole Body  Result Date: 09/18/2016 CLINICAL DATA:  Bronchogenic carcinoma.  L4 compression fracture. EXAM: NUCLEAR MEDICINE WHOLE BODY BONE SCAN TECHNIQUE: Whole body anterior and posterior images were obtained approximately 3 hours after intravenous injection of radiopharmaceutical. RADIOPHARMACEUTICALS:  25 MCi Technetium-28mMDP IV COMPARISON:  Lumbar spine MRI 08/28/2016 FINDINGS: Bladder and bilateral renal activity is present. There is a band of increased signal at the pathologic L4 body fracture. No abnormal signal elsewhere. IMPRESSION: Activity at the known L4 pathologic body fracture. No indication of osseous metastasis elsewhere. Electronically Signed   By: JMonte FantasiaM.D.   On: 09/18/2016 15:17   Dg C-arm 1-60 Min  Result Date: 09/16/2016 CLINICAL DATA:  L4-5 laminectomy/foraminectomy. EXAM: LUMBAR SPINE - 2-3 VIEW; DG C-ARM 61-120 MIN COMPARISON:  MRI lumbar spine 08/28/2016 FINDINGS: Two spot fluoroscopic images of the lumbar spine demonstrate a surgical instrument over the posterior elements at the L4-5 level. Known L4 compression fracture. Mild spondylosis of  the spine. Disc space narrowing at the L5-S1 level. Surgical clips are present anterior to the spine likely from previous vascular surgery. IMPRESSION: Mild spondylosis of the lumbar spine with disc disease at the L5-S1 level. Known L4 compression fracture with surgical instrument over the posterior elements at the L4-5 level. Electronically Signed   By: Marin Olp M.D.   On: 09/16/2016 18:54        IMPRESSION/PLAN: 1. 76 y.o. with likely metastatic lung cancer with brain metastasis and pathologic L4 fracture from bony metastasis. The patient's most pressing issue at present is the brain metastasis causing obstructive hydrocephalus.  The patient would benefit from surgical resection of the brain metastasis for relief of the mass effect/obstructive hydrocephalus. In addition, the patient would potentially benefit from pre-operative radiotherapy. The options include whole brain irradiation versus stereotactic radiosurgery. There are pros and cons associated with each of these potential treatment options. Whole brain radiotherapy would treat the known metastatic deposits and help provide some reduction of risk for future brain metastases. However, whole brain radiotherapy carries potential risks including hair loss, subacute somnolence, and neurocognitive changes including a possible reduction in short-term memory. Whole brain radiotherapy also may carry a lower likelihood of tumor control at the treatment sites because of the low-dose used. Stereotactic radiosurgery carries a higher likelihood for local tumor control at the targeted sites with lower associated risk for neurocognitive changes such as memory loss. However, the use of stereotactic radiosurgery in this setting may leave the patient at increased risk for new brain metastases elsewhere in the brain as high as 50-60%. Accordingly, patients who receive stereotactic radiosurgery in this setting should undergo ongoing surveillance imaging with brain MRI more frequently in order to identify and treat new small brain metastases before they become symptomatic. Stereotactic radiosurgery does carry some different risks, including a risk of radionecrosis.  PLAN: Today, I reviewed the findings and workup thus far with the patient. We discussed the dilemma regarding whole brain radiotherapy versus stereotactic radiosurgery. We discussed the pros and cons of  each. We also discussed the logistics and delivery of each. We reviewed the results associated with each of the treatments described above. The patient and his wife seem to understand the treatment options and would like to proceed with pre-operative stereotactic radiosurgery.  In terms of timing of the stereotactic radiosurgery, evidence suggests that risk of radionecrosis and leptomeningeal recurrence is lower when used in the pre-operative setting as opposed to post-operative SRS.  Tentatively, the patient will be set up for [facility] CT Simulation on 09/19/16 at 10am.  I have communicated with the patient's nurse, Benjamine Mola, RN, regarding making arrangements for [facility] to transport the patient over to [facility] by 9:30am on 09/19/16 in preparation for the simulation appointment.  CT SIM will be performed without contrast due to the patient's decreased renal function with current Creatinine of 1.6.   We will plan to fuse the CT scan with the MRI brain for better visualization of the masses.  We are still awaiting the patient's MRI brain and I have again stressed the importance of having this test completed prior to scheduled CT SIM.  Benjamine Mola, RN will follow up on this to ensure this is completed today.  We will continue to communicate closely with Dr. Kathyrn Sheriff regarding coordinating dates for [facility] treatment and surgical resection.  We are still awaiting final pathology results and will continue to follow up on these results.  Pending those results, we will certainly give further consideration  for treatment of additional sites of disease in the chest and spine if appropriate.  I spent 60 minutes face to face time with the patient and more than 50% of that time was spent in counseling and/or coordination of care.     Nicholos Johns, PA-C

## 2016-09-18 NOTE — Progress Notes (Signed)
No issues overnight. Able to get OOB yesterday with help. Wife indicated he seems improved from a pain standpoint.  EXAM:  BP (!) 126/55 (BP Location: Right Arm)   Pulse 79   Temp 99.3 F (37.4 C) (Oral)   Resp 18   Ht '5\' 9"'$  (1.753 m)   Wt 66.8 kg (147 lb 4.8 oz)   SpO2 99%   BMI 21.75 kg/m   Awake, alert Speech fluent, appropriate  CN grossly intact  5/5 BUE/BLE   IMPRESSION:  76 y.o. male POD# 2 s/p L4-5 decompression/biopsy for pathologic fracture from lung CA. Preliminary pathology indicates this is NOT small-cell cancer. 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  PLAN: - MRI brain still pending - have spoken with rad onc coordinator, will likely do preoperative SRS followed by surgery a day or two after.  I have updated the patient and his wife regarding the findings thus far and the planned treatment as documented above. All questions were answered.

## 2016-09-18 NOTE — Clinical Social Work Note (Addendum)
Clinical Social Work Assessment  Patient Details  Name: Zachary Mccarthy MRN: [id number] Date of Birth: [birth year]  Date of referral:  09/18/16               Reason for consult:  Discharge Planning                Permission sought to share information with:  Family Supports, Customer service manager Permission granted to share information::  Yes, Verbal Permission Granted  Name::     Baldo Hufnagle  Agency::  Spouse  Relationship::  SNFs  Contact Information:  [phone]  Housing/Transportation Living arrangements for the past 2 months:    Source of Information:  Spouse, Patient, Adult Children Patient Interpreter Needed:  None Criminal Activity/Legal Involvement Pertinent to Current Situation/Hospitalization:  No - Comment as needed Significant Relationships:  Adult Children, Other Family Members, Spouse Lives with:    Do you feel safe going back to the place where you live?  Yes Need for family participation in patient care:  Yes (Comment)  Care giving concerns: No care giving concerns.   Social Worker assessment / plan:  CSW met with pt yesterday at bedside to address consult for new SNF. Wife-Rosa Borkenhagen, dtr, and nephew present at bedside. CSW introduced self and explained SW responsibilities. Pt from home with spouse. P/T recommending STR at SNF. CSW provided SNF listing for review. Pt and family agreeable to SNF for STR. Spouse prefers [facility], [facility], or [facility].   CSW sent FL-2 to SNFs and completed PASRR. CSW will provide potential bed offers. CSW will continue to follow.   Employment status:  Retired Forensic scientist:  Advice worker) PT Recommendations:  [place] / Referral to community resources:  [employer]  Patient/Family's Response to care:  Pt and family appreciative for CSW support and guidance.   Patient/Family's Understanding of and Emotional  Response to Diagnosis, Current Treatment, and Prognosis:  Pt and family understand diagnosis, prognosis, and current treatment. Pt is in good spirits and family is very supportive.  Emotional Assessment Appearance:  Appears stated age Attitude/Demeanor/Rapport:  Other (Appropriate) Affect (typically observed):  Accepting, Adaptable, Pleasant Orientation:  Oriented to Self, Oriented to Place Alcohol / Substance use:  Other Psych involvement (Current and /or in the community):  No (Comment)  Discharge Needs  Concerns to be addressed:  Care Coordination Readmission within the last 30 days:  No Current discharge risk:  Dependent with Mobility Barriers to Discharge:  Continued Medical Work up   [employer], LCSW 09/18/2016, 10:26 AM

## 2016-09-18 NOTE — Progress Notes (Signed)
PROGRESS NOTE                                                                                                                                                                                                             Patient Demographics:    Zachary Mccarthy, is a 76 y.o. male, DOB - October 19, 1940, JWJ:[id number]  Admit date - 09/15/2016   Admitting Physician [name], MD  Outpatient Primary MD for the patient is [name], MD  LOS - 3  Outpatient Specialists:  No chief complaint on file.      Brief Narrative   76 year old male wit history of hypertension, hyperlipidemia, chronic kidney disease stage III, mild dementia, known invasive bladder cancer status post TURBT (currently on surveillance) admitted to neurosurgery service for severe right-sided back pain radiating to the right thigh and knee, progressive for the past 5 months severely limiting his mobility. (He is now wheelchair bound). MRI of the spine showed pathologic L4 compression fracture. On 5/1 ,Underwent right L4-5 laminectomy and foraminotomy with decompression, had L4 vertebral body biopsy. CT of the chest shows large right perihilar and infrahilar mass with mediastinal adenopathy and small right pleural effusion with postobstructive right lobe consolidation. Also has calcified pulmonary nodules on the right. Hypodense lesions in the liver also found. CT of the head done given findings of pulmonary nodules and some confusion showed right posterior parietal lobe hemorrhagic metastases with some mass effect.  Hospitalist consulting for acute confusion and other medical issues.   Subjective:   Back pain stable on current pain medication. No overnight events.   Assessment  & Plan :   Pathological compression fracture of lumbar spine Suspect secondary to metastasis from lung primary. Symptoms progressive over past 5 months associated with about 20  pound weight loss. -PT evaluation. Pain control per primary team.  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 -CT chest and head findings as above. MRI brain pending to further evaluate brain metastases. Started on Decadron for cerebral edema. - vertebral body biopsy results pending.  -Oncology consult appreciated. Bone scan ordered and further plan after biopsy results.  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 - S/p TURBT with negative cystoscopy on 06/03/2016 and has not had recurrence since 2015. - Followed by outpatient urology with [name].    Essential hypertension  Stable. Continue Norvasc and Coreg.    Hyperlipidemia Continue Zocor and Zetia.    Chronic kidney disease stage III Creatinine at baseline (2.5-9.5)  Acute metabolic encephalopathy Noted on 5/1. Seems resolved currently. No signs of infection.     Code Status : Full code  Family Communication  : Wife at bedside  Disposition Plan  : Per primary team and further hospital course.    Procedures  :   right L4-5 laminectomy and foraminotomy with decompression,   L4 vertebral body biopsy.   DVT Prophylaxis  :  Lovenox -  Lab Results  Component Value Date   PLT 137 (L) 09/18/2016    Antibiotics  :    Anti-infectives    Start     Dose/Rate Route Frequency Ordered Stop   09/17/16 0000  ceFAZolin (ANCEF) IVPB 2g/100 mL premix     2 g 200 mL/hr over 30 Minutes Intravenous Every 8 hours 09/16/16 2015 09/17/16 0827   09/16/16 1700  bacitracin 50,000 Units in sodium chloride irrigation 0.9 % 500 mL irrigation  Status:  Discontinued       As needed 09/16/16 1700 09/16/16 1835   09/16/16 1547  ceFAZolin (ANCEF) 2-4 GM/100ML-% IVPB    Comments:  Gallman, Kathie   : cabinet override      09/16/16 1547 09/17/16 0359        Objective:   Vitals:   09/18/16 0444 09/18/16 0700 09/18/16 0855 09/18/16 0947  BP: (!) 143/57 (!) 131/55  124/60  Pulse: 66 78 78  77  Resp: 18   19  Temp: 97.6 F (36.4 C) 97.8 F (36.6 C)  97.6 F (36.4 C)  TempSrc: Oral Oral  Oral  SpO2: 98% 98%  97%  Weight:      Height:        Wt Readings from Last 3 Encounters:  09/16/16 66.8 kg (147 lb 4.8 oz)     Intake/Output Summary (Last 24 hours) at 09/18/16 1054 Last data filed at 09/18/16 6387  Gross per 24 hour  Intake             1480 ml  Output                0 ml  Net             1480 ml     Physical Exam  Gen: not in distress HEENT:  moist mucosa, supple neck, Chest: clear b/l, no added sounds CVS: N S1&S2, no murmurs,  GI: soft, NT, ND,  Musculoskeletal: warm, no edema, clean Dressing over lower back      Data Review:    CBC  Recent Labs Lab 09/15/16 1507 09/16/16 0509 09/18/16 0242  WBC 4.0 4.2 10.1  HGB 11.8* 12.1* 10.4*  HCT 36.4* 37.7* 31.7*  PLT 134* 147* 137*  MCV 82.7 82.0 81.9  MCH 26.8 26.3 26.9  MCHC 32.4 32.1 32.8  RDW 15.1 15.0 15.1  LYMPHSABS  --   --  0.7  MONOABS  --   --  0.3  EOSABS  --   --  0.0  BASOSABS  --   --  0.0    Chemistries   Recent Labs Lab 09/15/16 1507 09/16/16 0509 09/18/16 0242  NA 137 137 133*  K 4.0 3.6 4.2  CL 105 105 103  CO2 24 24 20*  GLUCOSE 114* 97 152*  BUN 17 17 31*  CREATININE 1.57* 1.44* 1.64*  CALCIUM 9.2 9.1 8.3*  AST 26 26 25  ALT 16* 15* 8*  ALKPHOS 35* 38 32*  BILITOT 0.6 0.8 0.5   ------------------------------------------------------------------------------------------------------------------ No results for input(s): CHOL, HDL, LDLCALC, TRIG, CHOLHDL, LDLDIRECT in the last 72 hours.  Lab Results  Component Value Date   HGBA1C 6.5 (H) 09/15/2016   ------------------------------------------------------------------------------------------------------------------ No results for input(s): TSH, T4TOTAL, T3FREE, THYROIDAB in the last 72 hours.  Invalid input(s):  FREET3 ------------------------------------------------------------------------------------------------------------------ No results for input(s): VITAMINB12, FOLATE, FERRITIN, TIBC, IRON, RETICCTPCT in the last 72 hours.  Coagulation profile  Recent Labs Lab 09/16/16 0509  INR 1.10    No results for input(s): DDIMER in the last 72 hours.  Cardiac Enzymes No results for input(s): CKMB, TROPONINI, MYOGLOBIN in the last 168 hours.  Invalid input(s): CK ------------------------------------------------------------------------------------------------------------------ No results found for: BNP  Inpatient Medications  Scheduled Meds: . amLODipine  10 mg Oral Daily  . carvedilol  25 mg Oral BID WC  . dexamethasone  4 mg Intravenous Q6H  . docusate sodium  100 mg Oral BID  . ezetimibe  10 mg Oral Daily  . feeding supplement (ENSURE ENLIVE)  237 mL Oral TID BM  . gabapentin  300 mg Oral TID  . pantoprazole (PROTONIX) IV  40 mg Intravenous QHS  . senna  1 tablet Oral BID  . simvastatin  20 mg Oral q1800  . sodium chloride flush  3 mL Intravenous Q12H   Continuous Infusions: . sodium chloride 75 mL/hr at 09/18/16 0136  . sodium chloride    . lactated ringers 50 mL/hr at 09/15/16 1813  . methocarbamol (ROBAXIN)  IV    . pamidronate     PRN Meds:.bisacodyl, hydrALAZINE, HYDROcodone-acetaminophen, menthol-cetylpyridinium **OR** phenol, methocarbamol **OR** methocarbamol (ROBAXIN)  IV, morphine injection, ondansetron **OR** ondansetron (ZOFRAN) IV, polyethylene glycol, sodium chloride flush, sodium phosphate  Micro Results Recent Results (from the past 240 hour(s))  Surgical PCR screen     Status: None   Collection Time: 09/16/16  6:14 AM  Result Value Ref Range Status   MRSA, PCR NEGATIVE NEGATIVE Final   Staphylococcus aureus NEGATIVE NEGATIVE Final    Comment:        The Xpert SA Assay (FDA approved for NASAL specimens in patients over 22 years of age), is one component  of a comprehensive surveillance program.  Test performance has been validated by [facility] for patients greater than or equal to 37 year old. It is not intended to diagnose infection nor to guide or monitor treatment.     Radiology Reports Dg Chest 2 View  Result Date: 09/15/2016 CLINICAL DATA:  Preoperative respiratory evaluation. EXAM: CHEST  2 VIEW COMPARISON:  None. FINDINGS: The cardio pericardial silhouette is enlarged. Scattered nodules right lung may represent granulomata in there appears to be calcified lymph nodes in the right hilum. There is right base collapse/consolidation with small right pleural effusion. The visualized bony structures of the thorax are intact. IMPRESSION: 1. Multiple right pulmonary nodules, likely granulomata, but CT chest without contrast recommended to confirm. 2. Right base collapse/consolidation with small right pleural effusion. 3. Enlargement of the cardiopericardial silhouette, likely cardiomegaly although pericardial effusion not excluded. Electronically Signed   By: Misty Stanley M.D.   On: 09/15/2016 20:32   Dg Lumbar Spine 2-3 Views  Result Date: 09/16/2016 CLINICAL DATA:  L4-5 laminectomy/foraminectomy. EXAM: LUMBAR SPINE - 2-3 VIEW; DG C-ARM 61-120 MIN COMPARISON:  MRI lumbar spine 08/28/2016 FINDINGS: Two spot fluoroscopic images of the lumbar spine demonstrate a surgical instrument over the posterior elements at the L4-5 level. Known L4  compression fracture. Mild spondylosis of the spine. Disc space narrowing at the L5-S1 level. Surgical clips are present anterior to the spine likely from previous vascular surgery. IMPRESSION: Mild spondylosis of the lumbar spine with disc disease at the L5-S1 level. Known L4 compression fracture with surgical instrument over the posterior elements at the L4-5 level. Electronically Signed   By: Marin Olp M.D.   On: 09/16/2016 18:54   Ct Head Wo Contrast  Result Date: 09/16/2016 CLINICAL DATA:  Pulmonary  nodules, confusion EXAM: CT HEAD WITHOUT CONTRAST TECHNIQUE: Contiguous axial images were obtained from the base of the skull through the vertex without intravenous contrast. COMPARISON:  Same day chest CT FINDINGS: The study is compromised by motion artifacts. Brain: There is an ill-defined small right posterior parietal intra-axial hyperdensity seen, series 3, image 20 with an approximate 3.5 cm hypodense masslike abnormality in the right cerebellar hemisphere causing positive mass effect on the fourth ventricle. Findings are concerning for metastatic disease and edema given recent findings within the chest CT. More confluent areas of hypodensity are seen in the left frontal white matter likely representing transependymal spread of CSF. No extra-axial fluid collections are identified. Postobstructive dilatation on the lateral and third ventricles from mass effect on the fourth ventricle is noted. Vascular: No hyperdense vessel or unexpected calcification. Skull: Negative for focal lesions.  No fracture. Sinuses/Orbits: Limited by motion artifacts.  No acute finding. Other: None IMPRESSION: Intra-axial ill-defined hyperdensity in the posterior right parietal lobe may represent a small hemorrhagic metastasis. An additional 3.5 cm hypodense mass with adjacent edema in the right cerebellar hemisphere with positive mass effect on the fourth ventricle is also noted. These in conjunction CT chest findings are concerning for metastatic disease. MRI may help for further correlation. Postobstructive dilatation of the lateral third ventricles secondary to mass-effect of the fourth ventricle with transependymal spread of CSF in the left frontal lobe. Critical Value/emergent results were called by telephone at the time of interpretation on 09/16/2016 at 11:29 pm to Nurse Elita Boone, who verbally acknowledged these results and will be contacting the oncall Neurosurgeon immediately. Electronically Signed   By: Ashley Royalty  M.D.   On: 09/16/2016 23:29   Ct Chest Wo Contrast  Result Date: 09/16/2016 CLINICAL DATA:  Confusion, pulmonary nodules EXAM: CT CHEST WITHOUT CONTRAST TECHNIQUE: Multidetector CT imaging of the chest was performed following the standard protocol without IV contrast. COMPARISON:  None. FINDINGS: Cardiovascular: Cardiomegaly with small pericardial effusion and coronary arteriosclerosis. There is aortic atherosclerosis with aneurysmal dilatation of the ascending aorta up to 4.2 cm. There is dilatation of the main pulmonary artery to 3.9 cm consistent with a component of pulmonary hypertension. Atherosclerosis in the proximal great vessels is identified. Mediastinum/Nodes: Necrotic appearing cervical adenopathy is seen at the base of the neck on the left with central areas of hypodensity. Prevascular adenopathy measuring up to 1.5 cm short axis Left lower paratracheal 1.7 cm short axis lymphadenopathy is noted. The trachea is patent. The left mainstem bronchus is patent. There is narrowing of the right mainstem bronchus and bronchi to the right middle and lower lobes secondary to a mass about the right hilum described below. Lungs/Pleura: There is a large masslike abnormality in the infrahilar right hemithorax abutting the right heart border and measuring roughly 11 x 7.3 x 5.8 cm in AP by transverse by CC dimension. Exact margins are difficult to distinguish given lack of IV contrast and also from postobstructive changes to the right middle and lower lobes. Additionally  there are calcified nodules in the right upper, middle and lower lobes consistent granulomas. Right upper lobe 4.1 x 3.1 mm noncalcified nodule, series 5, image 31, oblong 8.1 x 2.9 mm nodule series 5, image 35, posterior segment right upper lobe 5.3 mm nodule series 5, image 70, superior segment right lower lobe series 5, image 83 4.6 mm nodules are also seen potentially representing metastatic nodules or noncalcified granulomas in light of the  above findings. Small right pleural effusion is present. Upper Abdomen: Calcified granuloma in the right middle lobe. Indeterminate 12 mm hypodense lesion in the left hepatic lobe series 4, image 165 and the right hepatic dome mm hypodensity, series 4, image 129 are noted. Mild thickening of the adrenal glands. Aortic and branch vessel atherosclerosis is identified within the upper abdomen. There is mild fullness of both renal collecting systems. Musculoskeletal: No suspicious lytic or blastic disease. IMPRESSION: 1. Large right perihilar and infrahilar masslike abnormality measuring 11 x 7.3 x 5.8 cm with mediastinal adenopathy. Associated small right pleural effusion with postobstructive consolidations in the right lower lobe. Findings are concerning for bronchogenic carcinoma with metastatic adenopathy. There appears be cervical necrotic adenopathy at the base of the neck on the left for which CT or MRI with IV contrast is also recommended for better assessment. 2. Calcified and noncalcified subcentimeter pulmonary nodules are noted predominantly within the right lung. A calcified nodules may simply reflect granulomas however the noncalcified nodules cannot exclude metastatic pulmonary nodules. 3. Indeterminate hypodense lesions in the liver measuring 7 mm on the right and 12 mm on the left. 4. Aortic atherosclerosis.  Coronary arteriosclerosis. 5. 4.2 cm ascending aortic aneurysm. Recommend annual imaging followup by CTA or MRA. This recommendation follows 2010 ACCF/AHA/AATS/ACR/ASA/SCA/SCAI/SIR/STS/SVM Guidelines for the Diagnosis and Management of Patients with Thoracic Aortic Disease. Circulation. 2010; 121: Q762-U633 Electronically Signed   By: Ashley Royalty M.D.   On: 09/16/2016 23:11   Dg C-arm 1-60 Min  Result Date: 09/16/2016 CLINICAL DATA:  L4-5 laminectomy/foraminectomy. EXAM: LUMBAR SPINE - 2-3 VIEW; DG C-ARM 61-120 MIN COMPARISON:  MRI lumbar spine 08/28/2016 FINDINGS: Two spot fluoroscopic images  of the lumbar spine demonstrate a surgical instrument over the posterior elements at the L4-5 level. Known L4 compression fracture. Mild spondylosis of the spine. Disc space narrowing at the L5-S1 level. Surgical clips are present anterior to the spine likely from previous vascular surgery. IMPRESSION: Mild spondylosis of the lumbar spine with disc disease at the L5-S1 level. Known L4 compression fracture with surgical instrument over the posterior elements at the L4-5 level. Electronically Signed   By: Marin Olp M.D.   On: 09/16/2016 18:54    Time Spent in minutes  25   Louellen Molder M.D on 09/18/2016 at 10:54 AM  Between 7am to 7pm - Pager - [phone]  After 7pm go to [email or web address] - password [facility]  [facility] -  Office  [phone]

## 2016-09-18 NOTE — Clinical Social Work Placement (Signed)
   CLINICAL SOCIAL WORK PLACEMENT  NOTE  Date:  09/18/2016  Patient Details  Name: Zachary Mccarthy MRN: [id number] Date of Birth: [birth year]  Clinical Social Work is seeking post-discharge placement for this patient at the [place] level of care (*CSW will initial, date and re-position this form in  chart as items are completed):  Yes   Patient/family provided with [facility] Work Department's list of facilities offering this level of care within the geographic area requested by the patient (or if unable, by the patient's family).  Yes   Patient/family informed of their freedom to choose among providers that offer the needed level of care, that participate in Medicare, Medicaid or managed care program needed by the patient, have an available bed and are willing to accept the patient.  Yes   Patient/family informed of [facility]'s ownership interest in [facility] and [facility], as well as of the fact that they are under no obligation to receive care at these facilities.  PASRR submitted to [facility] on 09/18/16     PASRR number received on 09/18/16     Existing PASRR number confirmed on       FL2 transmitted to all facilities in geographic area requested by pt/family on 09/18/16     FL2 transmitted to all facilities within larger geographic area on 09/18/16     Patient informed that his/her managed care company has contracts with or will negotiate with certain facilities, including the following:            Patient/family informed of bed offers received.  Patient chooses bed at       Physician recommends and patient chooses bed at      Patient to be transferred to   on  .  Patient to be transferred to facility by       Patient family notified on   of transfer.  Name of family member notified:        PHYSICIAN       Additional Comment:    _______________________________________________ Truitt Merle, LCSW 09/18/2016, 11:08 AM

## 2016-09-19 ENCOUNTER — Ambulatory Visit
Admit: 2016-09-19 | Discharge: 2016-09-19 | Disposition: A | Discharge status: Home / Self Care | Payer: Medicare Other | Attending: Radiation Oncology | Admitting: Radiation Oncology

## 2016-09-19 ENCOUNTER — Other Ambulatory Visit: Payer: Self-pay | Admitting: Radiation Therapy

## 2016-09-19 VITALS — BP 156/87 | HR 79 | Temp 97.7°F | Resp 16

## 2016-09-19 DIAGNOSIS — C7949 Secondary malignant neoplasm of other parts of nervous system: Principal | ICD-10-CM

## 2016-09-19 DIAGNOSIS — C7931 Secondary malignant neoplasm of brain: Secondary | ICD-10-CM | POA: Insufficient documentation

## 2016-09-19 DIAGNOSIS — C3491 Malignant neoplasm of unspecified part of right bronchus or lung: Secondary | ICD-10-CM

## 2016-09-19 DIAGNOSIS — C787 Secondary malignant neoplasm of liver and intrahepatic bile duct: Secondary | ICD-10-CM

## 2016-09-19 LAB — BASIC METABOLIC PANEL
Anion gap: 9 (ref 5–15)
BUN: 32 mg/dL — AB (ref 6–20)
CHLORIDE: 106 mmol/L (ref 101–111)
CO2: 21 mmol/L — ABNORMAL LOW (ref 22–32)
Calcium: 8.8 mg/dL — ABNORMAL LOW (ref 8.9–10.3)
Creatinine, Ser: 1.41 mg/dL — ABNORMAL HIGH (ref 0.61–1.24)
GFR calc Af Amer: 54 mL/min — ABNORMAL LOW (ref >60)
GFR calc non Af Amer: 47 mL/min — ABNORMAL LOW (ref >60)
Glucose, Bld: 129 mg/dL — ABNORMAL HIGH (ref 65–99)
POTASSIUM: 4.4 mmol/L (ref 3.5–5.1)
SODIUM: 136 mmol/L (ref 135–145)

## 2016-09-19 NOTE — Progress Notes (Signed)
Occupational Therapy Treatment Patient Details Name: Zachary Mccarthy MRN: [id number] DOB: December 17, 1940 Today's Date: 09/19/2016    History of present illness Pt is a 76 y/o male s/p right L4-5 laminotomy/foraminotomy and L4 vertebral body biopsy. Pt has a past medical history of  Hyperlipidemia; Hypertension; Cataract extraction; and Vascular surgery. 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   OT comments  Pt progressing towards OT goals. This session focused on bed mobility, dressing, and re-educating wife on precautions. Session limited as Pt was transferring to [facility] for a procedure/treatment. Pt continues to benefit from skilled OT in the acute setting, next session to focus on functional transfers and potentially AE education if Pt is more cognitively clear.  Follow Up Recommendations  SNF;Supervision/Assistance - 24 hour    Equipment Recommendations  Other (comment) (defer to next venue)    Recommendations for Other Services      Precautions / Restrictions Precautions Precautions: Fall;Back Precaution Booklet Issued: No Precaution Comments: reviewed back precautions Restrictions Weight Bearing Restrictions: No       Mobility Bed Mobility Overal bed mobility: Needs Assistance Bed Mobility: Rolling Rolling: Min assist         General bed mobility comments: verbal cues for sequencing and maintaining back precautions  Transfers                 General transfer comment: Pt transferred with PTAR from bed to stretcher - total A, Pt able to follow verbal cues    Balance                                           ADL either performed or assessed with clinical judgement   ADL Overall ADL's : Needs assistance/impaired     Grooming: Set up;Wash/dry  face;Wash/dry hands;Bed level Grooming Details (indicate cue type and reason): vc for task at hand for completion         Upper Body Dressing : Set up;Bed level Upper Body Dressing Details (indicate cue type and reason): don hospital gown in bed         Toileting- Clothing Manipulation and Hygiene: Maximal assistance;Bed level (rolling with cues)               Vision       Perception     Praxis      Cognition Arousal/Alertness: Awake/alert Behavior During Therapy: WFL for tasks assessed/performed Overall Cognitive Status: Impaired/Different from baseline Area of Impairment: Orientation;Following commands;Safety/judgement                 Orientation Level: Disoriented to;Place;Time     Following Commands: Follows one step commands consistently Safety/Judgement: Decreased awareness of safety;Decreased awareness of deficits     General Comments: Spouse states that he is not normally confused        Exercises     Shoulder Instructions       General Comments Pt at the end going with EMS/PTAR to [facility] for treatment    Pertinent Vitals/ Pain       Pain Assessment: No/denies pain Pain Intervention(s): Limited activity within patient's tolerance;Monitored during session  Home Living  Prior Functioning/Environment              Frequency  Min 2X/week        Progress Toward Goals  OT Goals(current goals can now be found in the care plan section)  Progress towards OT goals: Progressing toward goals  Acute Rehab OT Goals Patient Stated Goal: not expressed  Plan Discharge plan remains appropriate    Co-evaluation                 AM-PAC PT "6 Clicks" Daily Activity     Outcome Measure   Help from another person eating meals?: A Little Help from another person taking care of personal grooming?: A Little Help from another person toileting, which includes using toliet, bedpan,  or urinal?: A Lot Help from another person bathing (including washing, rinsing, drying)?: A Lot Help from another person to put on and taking off regular upper body clothing?: A Little Help from another person to put on and taking off regular lower body clothing?: A Lot 6 Click Score: 15    End of Session    OT Visit Diagnosis: Unsteadiness on feet (R26.81);Other abnormalities of gait and mobility (R26.89);Muscle weakness (generalized) (M62.81);Other symptoms and signs involving cognitive function   Activity Tolerance Patient tolerated treatment well   Patient Left Other (comment) (on stretcher for transfer)   Nurse Communication Mobility status;Precautions        Time: [phone] OT Time Calculation (min): 23 min  Charges: OT General Charges $OT Visit: 1 Procedure OT Treatments $Self Care/Home Management : 8-22 mins Hulda Humphrey OTR/L [place] 09/19/2016, 11:23 AM

## 2016-09-19 NOTE — Progress Notes (Signed)
Still awaiting the pathology report. Hopefully, this will be out today.  The bone scan only shows activity at L4. No other bone metastasis are noted.  I appreciate Dr. Cleotilde Neer input. I agree that this cerebellar met has to be resected. It sounds like he will have SRS first and then have the resection.  He did get his Aredia. This will help with his bones.  His labs don't look that bad. His creatinine is getting better.  The MRI of the brain was done. He has the large right cerebellar met. However, it looks like he has multiple other metastasis. His problem clearly however will be this cerebellar met. I suspect that he probably will need whole brain radiation.  Again, the pathology report will be critical.  Lattie Haw, MD  Psalm 138:7

## 2016-09-19 NOTE — Progress Notes (Signed)
PT Cancellation Note  Patient Details Name: Zachary Mccarthy MRN: [id number] DOB: Dec 15, 1940   Cancelled Treatment:    Reason Eval/Treat Not Completed: Other (comment). Pt just transferred to [facility] for treatment. PT will continue to f/u with pt as available.   [place] 09/19/2016, 9:33 AM

## 2016-09-19 NOTE — Progress Notes (Signed)
No issues overnight. Pt had MRI, poor quality due to movement.  EXAM:  BP 132/78 (BP Location: Left Arm)   Pulse 79   Temp 97.9 F (36.6 C) (Oral)   Resp 16   Ht 5' 9"  (1.753 m)   Wt 66.8 kg (147 lb 4.8 oz)   SpO2 95%   BMI 21.75 kg/m   Awake, alert,  Speech fluent,  CN grossly intact  5/5 BUE/BLE   IMPRESSION:  76 y.o. male s/p lumbar decompression with metastatic lung CA. Has large right cerebellar met with HCP. Will need preop SRS to this and any other brain mets followed by resection. Will need anesthesia for MRI in order to plan SRS.  PLAN: - Will get MRI with anesthesia. - Coordinating with Dr. Lisbeth Renshaw, Rad Onc to do preop SRS next week, followed by surgery - Mobilize as tolerated with PT/OT  I updated the patient's family on the plan above. All questions were answered.

## 2016-09-19 NOTE — Progress Notes (Signed)
PROGRESS NOTE                                                                                                                                                                                                             Patient Demographics:    Zachary Mccarthy, is a 76 y.o. male, DOB - 31-Jan-1941, PRF:[id number]  Admit date - 09/15/2016   Admitting Physician [name], MD  Outpatient Primary MD for the patient is [name], MD  LOS - 4  Outpatient Specialists:  No chief complaint on file.      Brief Narrative   76 year old male wit history of hypertension, hyperlipidemia, chronic kidney disease stage III, mild dementia, known invasive bladder cancer status post TURBT (currently on surveillance) admitted to neurosurgery service for severe right-sided back pain radiating to the right thigh and knee, progressive for the past 5 months severely limiting his mobility. (He is now wheelchair bound). MRI of the spine showed pathologic L4 compression fracture. On 5/1 ,Underwent right L4-5 laminectomy and foraminotomy with decompression, had L4 vertebral body biopsy. CT of the chest shows large right perihilar and infrahilar mass with mediastinal adenopathy and small right pleural effusion with postobstructive right lobe consolidation. Also has calcified pulmonary nodules on the right. Hypodense lesions in the liver also found. CT of the head done given findings of pulmonary nodules and some confusion showed right posterior parietal lobe hemorrhagic metastases with some mass effect.  Hospitalist consulting for acute confusion and other medical issues.   Subjective:   Patient seen after returning from [place]. Denies any back pain. Has some confusion.   Assessment  & Plan :   Pathological compression fracture of lumbar spine  secondary to metastasis from lung primary. Symptoms progressive over past 5 months  associated with about 20 pound weight loss. -PT evaluation. Pain controlled on current regimen.  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 -CT chest and head findings as above. MRI brain shows . Started on Decadron for cerebral edema. - Vertebral biopsy shows poorly differential adenocarcinoma. 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 Bone scan shows activity only at L4 vertebral fracture. No other osseous metastases. -Oncology consult appreciated. SRS CT simulation today. -Primary team will schedule surgical resection of  the cerebellar mass along with radiation therapy.  Acute encephalopathy Patient pleasantly confused possibly due to brain metastases. Continue to monitor.  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 - S/p TURBT with negative cystoscopy on 06/03/2016 and has not had recurrence since 2015. - Followed by outpatient urology ([name].)    Essential hypertension Stable. Continue Norvasc and Coreg.    Hyperlipidemia Continue Zocor and Zetia.    Chronic kidney disease stage III Creatinine improved. (Baseline 1.5-1.9)       Code Status : Full code  Family Communication  : Wife at bedside  Disposition Plan  : Per primary team and further hospital course.    Procedures  :   right L4-5 laminectomy and foraminotomy with decompression,   L4 vertebral body biopsy. MRI brain Bone scan    DVT Prophylaxis  :  Lovenox -  Lab Results  Component Value Date   PLT 137 (L) 09/18/2016    Antibiotics  :    Anti-infectives    Start     Dose/Rate Route Frequency Ordered Stop   09/17/16 0000  ceFAZolin (ANCEF) IVPB 2g/100 mL premix     2 g 200 mL/hr over 30 Minutes Intravenous Every 8 hours 09/16/16 2015 09/17/16 0827   09/16/16 1700  bacitracin 50,000 Units in sodium chloride irrigation 0.9 %  500 mL irrigation  Status:  Discontinued       As needed 09/16/16 1700 09/16/16 1835   09/16/16 1547  ceFAZolin (ANCEF) 2-4 GM/100ML-% IVPB    Comments:  Gallman, Kathie   : cabinet override      09/16/16 1547 09/17/16 0359        Objective:   Vitals:   09/18/16 1845 09/18/16 2236 09/19/16 0108 09/19/16 0847  BP: 140/66 (!) 151/83 137/73 (!) 141/69  Pulse: 87 80 69 73  Resp: '20 20 18 18  '$ Temp: 98.3 F (36.8 C) 97.5 F (36.4 C) 97.5 F (36.4 C) 97.6 F (36.4 C)  TempSrc: Oral Axillary Axillary Oral  SpO2: 98% 97% 94% 99%  Weight:      Height:        Wt Readings from Last 3 Encounters:  09/16/16 66.8 kg (147 lb 4.8 oz)     Intake/Output Summary (Last 24 hours) at 09/19/16 1334 Last data filed at 09/18/16 1550  Gross per 24 hour  Intake              740 ml  Output                0 ml  Net              740 ml     Physical Exam  Gen: Elderly male not in distress HEENT:  moist mucosa, supple neck, Chest: Clear to auscultation CVS: N S1&S2, no murmurs,  GI: soft, NT, ND,  Musculoskeletal: warm, no edema, clean Dressing over lower back  CNS: Alert and awake, confused     Data Review:    CBC  Recent Labs Lab 09/15/16 1507 09/16/16 0509 09/18/16 0242  WBC 4.0 4.2 10.1  HGB 11.8* 12.1* 10.4*  HCT 36.4* 37.7* 31.7*  PLT 134* 147* 137*  MCV 82.7 82.0 81.9  MCH 26.8 26.3 26.9  MCHC 32.4 32.1 32.8  RDW 15.1 15.0 15.1  LYMPHSABS  --   --  0.7  MONOABS  --   --  0.3  EOSABS  --   --  0.0  BASOSABS  --   --  0.0    Chemistries   Recent Labs Lab 09/15/16 1507 09/16/16 0509 09/18/16 0242 09/19/16 0458  NA 137 137 133* 136  K 4.0 3.6 4.2 4.4  CL 105 105 103 106  CO2 24 24 20* 21*  GLUCOSE 114* 97 152* 129*  BUN 17 17 31* 32*  CREATININE 1.57* 1.44* 1.64* 1.41*  CALCIUM 9.2 9.1 8.3* 8.8*  AST '26 26 25  '$ --   ALT 16* 15* 8*  --   ALKPHOS 35* 38 32*  --   BILITOT 0.6 0.8 0.5  --     ------------------------------------------------------------------------------------------------------------------ No results for input(s): CHOL, HDL, LDLCALC, TRIG, CHOLHDL, LDLDIRECT in the last 72 hours.  Lab Results  Component Value Date   HGBA1C 6.5 (H) 09/15/2016   ------------------------------------------------------------------------------------------------------------------ No results for input(s): TSH, T4TOTAL, T3FREE, THYROIDAB in the last 72 hours.  Invalid input(s): FREET3 ------------------------------------------------------------------------------------------------------------------ No results for input(s): VITAMINB12, FOLATE, FERRITIN, TIBC, IRON, RETICCTPCT in the last 72 hours.  Coagulation profile  Recent Labs Lab 09/16/16 0509  INR 1.10    No results for input(s): DDIMER in the last 72 hours.  Cardiac Enzymes No results for input(s): CKMB, TROPONINI, MYOGLOBIN in the last 168 hours.  Invalid input(s): CK ------------------------------------------------------------------------------------------------------------------ No results found for: BNP  Inpatient Medications  Scheduled Meds: . amLODipine  10 mg Oral Daily  . carvedilol  25 mg Oral BID WC  . dexamethasone  4 mg Intravenous Q6H  . docusate sodium  100 mg Oral BID  . ezetimibe  10 mg Oral Daily  . feeding supplement (ENSURE ENLIVE)  237 mL Oral TID BM  . gabapentin  300 mg Oral TID  . pantoprazole  40 mg Oral QHS  . senna  1 tablet Oral BID  . simvastatin  20 mg Oral q1800  . sodium chloride flush  3 mL Intravenous Q12H   Continuous Infusions: . sodium chloride 75 mL/hr at 09/18/16 0136  . sodium chloride    . lactated ringers 50 mL/hr at 09/15/16 1813  . methocarbamol (ROBAXIN)  IV     PRN Meds:.bisacodyl, hydrALAZINE, HYDROcodone-acetaminophen, menthol-cetylpyridinium **OR** phenol, methocarbamol **OR** methocarbamol (ROBAXIN)  IV, morphine injection, ondansetron **OR** ondansetron  (ZOFRAN) IV, polyethylene glycol, sodium chloride flush, sodium phosphate  Micro Results Recent Results (from the past 240 hour(s))  Surgical PCR screen     Status: None   Collection Time: 09/16/16  6:14 AM  Result Value Ref Range Status   MRSA, PCR NEGATIVE NEGATIVE Final   Staphylococcus aureus NEGATIVE NEGATIVE Final    Comment:        The Xpert SA Assay (FDA approved for NASAL specimens in patients over 93 years of age), is one component of a comprehensive surveillance program.  Test performance has been validated by [facility] for patients greater than or equal to 49 year old. It is not intended to diagnose infection nor to guide or monitor treatment.     Radiology Reports Dg Chest 2 View  Result Date: 09/15/2016 CLINICAL DATA:  Preoperative respiratory evaluation. EXAM: CHEST  2 VIEW COMPARISON:  None. FINDINGS: The cardio pericardial silhouette is enlarged. Scattered nodules right lung may represent granulomata in there appears to be calcified lymph nodes in the right hilum. There is right base collapse/consolidation with small right pleural effusion. The visualized bony structures of the thorax are intact. IMPRESSION: 1. Multiple right pulmonary nodules, likely granulomata, but CT chest without contrast recommended to confirm. 2. Right base collapse/consolidation with small right pleural effusion. 3. Enlargement of the cardiopericardial  silhouette, likely cardiomegaly although pericardial effusion not excluded. Electronically Signed   By: Misty Stanley M.D.   On: 09/15/2016 20:32   Dg Lumbar Spine 2-3 Views  Result Date: 09/16/2016 CLINICAL DATA:  L4-5 laminectomy/foraminectomy. EXAM: LUMBAR SPINE - 2-3 VIEW; DG C-ARM 61-120 MIN COMPARISON:  MRI lumbar spine 08/28/2016 FINDINGS: Two spot fluoroscopic images of the lumbar spine demonstrate a surgical instrument over the posterior elements at the L4-5 level. Known L4 compression fracture. Mild spondylosis of the spine. Disc space  narrowing at the L5-S1 level. Surgical clips are present anterior to the spine likely from previous vascular surgery. IMPRESSION: Mild spondylosis of the lumbar spine with disc disease at the L5-S1 level. Known L4 compression fracture with surgical instrument over the posterior elements at the L4-5 level. Electronically Signed   By: Marin Olp M.D.   On: 09/16/2016 18:54   Ct Head Wo Contrast  Result Date: 09/16/2016 CLINICAL DATA:  Pulmonary nodules, confusion EXAM: CT HEAD WITHOUT CONTRAST TECHNIQUE: Contiguous axial images were obtained from the base of the skull through the vertex without intravenous contrast. COMPARISON:  Same day chest CT FINDINGS: The study is compromised by motion artifacts. Brain: There is an ill-defined small right posterior parietal intra-axial hyperdensity seen, series 3, image 20 with an approximate 3.5 cm hypodense masslike abnormality in the right cerebellar hemisphere causing positive mass effect on the fourth ventricle. Findings are concerning for metastatic disease and edema given recent findings within the chest CT. More confluent areas of hypodensity are seen in the left frontal white matter likely representing transependymal spread of CSF. No extra-axial fluid collections are identified. Postobstructive dilatation on the lateral and third ventricles from mass effect on the fourth ventricle is noted. Vascular: No hyperdense vessel or unexpected calcification. Skull: Negative for focal lesions.  No fracture. Sinuses/Orbits: Limited by motion artifacts.  No acute finding. Other: None IMPRESSION: Intra-axial ill-defined hyperdensity in the posterior right parietal lobe may represent a small hemorrhagic metastasis. An additional 3.5 cm hypodense mass with adjacent edema in the right cerebellar hemisphere with positive mass effect on the fourth ventricle is also noted. These in conjunction CT chest findings are concerning for metastatic disease. MRI may help for further  correlation. Postobstructive dilatation of the lateral third ventricles secondary to mass-effect of the fourth ventricle with transependymal spread of CSF in the left frontal lobe. Critical Value/emergent results were called by telephone at the time of interpretation on 09/16/2016 at 11:29 pm to Nurse Elita Boone, who verbally acknowledged these results and will be contacting the oncall Neurosurgeon immediately. Electronically Signed   By: Ashley Royalty M.D.   On: 09/16/2016 23:29   Ct Chest Wo Contrast  Result Date: 09/16/2016 CLINICAL DATA:  Confusion, pulmonary nodules EXAM: CT CHEST WITHOUT CONTRAST TECHNIQUE: Multidetector CT imaging of the chest was performed following the standard protocol without IV contrast. COMPARISON:  None. FINDINGS: Cardiovascular: Cardiomegaly with small pericardial effusion and coronary arteriosclerosis. There is aortic atherosclerosis with aneurysmal dilatation of the ascending aorta up to 4.2 cm. There is dilatation of the main pulmonary artery to 3.9 cm consistent with a component of pulmonary hypertension. Atherosclerosis in the proximal great vessels is identified. Mediastinum/Nodes: Necrotic appearing cervical adenopathy is seen at the base of the neck on the left with central areas of hypodensity. Prevascular adenopathy measuring up to 1.5 cm short axis Left lower paratracheal 1.7 cm short axis lymphadenopathy is noted. The trachea is patent. The left mainstem bronchus is patent. There is narrowing of the right mainstem  bronchus and bronchi to the right middle and lower lobes secondary to a mass about the right hilum described below. Lungs/Pleura: There is a large masslike abnormality in the infrahilar right hemithorax abutting the right heart border and measuring roughly 11 x 7.3 x 5.8 cm in AP by transverse by CC dimension. Exact margins are difficult to distinguish given lack of IV contrast and also from postobstructive changes to the right middle and lower lobes.  Additionally there are calcified nodules in the right upper, middle and lower lobes consistent granulomas. Right upper lobe 4.1 x 3.1 mm noncalcified nodule, series 5, image 31, oblong 8.1 x 2.9 mm nodule series 5, image 35, posterior segment right upper lobe 5.3 mm nodule series 5, image 70, superior segment right lower lobe series 5, image 83 4.6 mm nodules are also seen potentially representing metastatic nodules or noncalcified granulomas in light of the above findings. Small right pleural effusion is present. Upper Abdomen: Calcified granuloma in the right middle lobe. Indeterminate 12 mm hypodense lesion in the left hepatic lobe series 4, image 165 and the right hepatic dome mm hypodensity, series 4, image 129 are noted. Mild thickening of the adrenal glands. Aortic and branch vessel atherosclerosis is identified within the upper abdomen. There is mild fullness of both renal collecting systems. Musculoskeletal: No suspicious lytic or blastic disease. IMPRESSION: 1. Large right perihilar and infrahilar masslike abnormality measuring 11 x 7.3 x 5.8 cm with mediastinal adenopathy. Associated small right pleural effusion with postobstructive consolidations in the right lower lobe. Findings are concerning for bronchogenic carcinoma with metastatic adenopathy. There appears be cervical necrotic adenopathy at the base of the neck on the left for which CT or MRI with IV contrast is also recommended for better assessment. 2. Calcified and noncalcified subcentimeter pulmonary nodules are noted predominantly within the right lung. A calcified nodules may simply reflect granulomas however the noncalcified nodules cannot exclude metastatic pulmonary nodules. 3. Indeterminate hypodense lesions in the liver measuring 7 mm on the right and 12 mm on the left. 4. Aortic atherosclerosis.  Coronary arteriosclerosis. 5. 4.2 cm ascending aortic aneurysm. Recommend annual imaging followup by CTA or MRA. This recommendation follows  2010 ACCF/AHA/AATS/ACR/ASA/SCA/SCAI/SIR/STS/SVM Guidelines for the Diagnosis and Management of Patients with Thoracic Aortic Disease. Circulation. 2010; 121: E366-Q947 Electronically Signed   By: Ashley Royalty M.D.   On: 09/16/2016 23:11   Mr Jeri Cos ML Contrast  Result Date: 09/18/2016 CLINICAL DATA:  Multiple brain metastasis, here for pre-surgical planning. EXAM: MRI HEAD WITHOUT AND WITH CONTRAST TECHNIQUE: Multiplanar, multiecho pulse sequences of the brain and surrounding structures were obtained without and with intravenous contrast. Coronal T2, sagittal and coronal T1 post gadolinium not obtained. CONTRAST:  22m MULTIHANCE GADOBENATE DIMEGLUMINE 529 MG/ML IV SOLN COMPARISON:  CT HEAD Sep 16, 2016 FINDINGS: Multiple sequences are moderately or severely motion degraded. Per technologist note, patient was confused and had difficulty remaining still and following directions. Maximum medications given and multiple sequences re-attempted. Severely motion degraded post gadolinium axial T1 sequences. INTRACRANIAL CONTENTS: Due to motion and lack of contrast, multiple intracranial metastasis or difficult to characterize in measurements are approximations. Multiple complex heterogeneous metastasis with susceptibility artifact consistent with blood products in T2 bright surrounding vasogenic edema as follows: 4.8 x 4.9 cm RIGHT cerebellum, 13 x 14 mm LEFT cerebellum, 5 mm LEFT cerebellum 11 x 9 mm RIGHT temporal lobe, 19 x 17 mm RIGHT occipital lobe, 13 x 18 mm LEFT inferior frontal lobe. RIGHT cerebellar mass nearly effaces the fourth  ventricle, narrowing the cerebral aqua duct in resulting in moderate to severe obstructive hydrocephalus with a component of interstitial edema. Mild upwards and inferior cerebellar herniation. Mildly effaced pre pontine cistern. No supratentorial midline shift. A few additional scattered micro hemorrhages could represent metastasis or sequelae of chronic hypertension. Mild additional  presumed chronic small vessel ischemic disease. No abnormal extra-axial fluid collections. VASCULAR: Normal major intracranial vascular flow voids present at skull base. SKULL AND UPPER CERVICAL SPINE: No abnormal sellar expansion. No suspicious calvarial bone marrow signal. Craniocervical junction maintained. Severe degenerative change of the included cervical spine SINUSES/ORBITS: Small LEFT maxillary mucosal retention cyst without paranasal sinus air-fluid levels. Mastoid air cells are well aerated. The included ocular globes and orbital contents are non-suspicious. Status post bilateral ocular lens implants. OTHER: Patient is edentulous. No abnormal extra-axial fluid collections. IMPRESSION: Limited, incomplete MRI of the brain, nondiagnostic post gadolinium axial T1 sequences, limiting detection of metastatic disease. At least 3 infratentorial and 3 supratentorial hemorrhagic metastasis (additional metastasis may be present). Dominant 4.8 x 4.9 cm RIGHT cerebellar metastasis results in moderate to severe obstructive hydrocephalus. These results will be called to the ordering clinician or representative by the Radiologist Assistant, and communication documented in the [employer] Dashboard. Electronically Signed   By: Elon Alas M.D.   On: 09/18/2016 22:58   Nm Bone Scan Whole Body  Result Date: 09/18/2016 CLINICAL DATA:  Bronchogenic carcinoma.  L4 compression fracture. EXAM: NUCLEAR MEDICINE WHOLE BODY BONE SCAN TECHNIQUE: Whole body anterior and posterior images were obtained approximately 3 hours after intravenous injection of radiopharmaceutical. RADIOPHARMACEUTICALS:  25 MCi Technetium-73mMDP IV COMPARISON:  Lumbar spine MRI 08/28/2016 FINDINGS: Bladder and bilateral renal activity is present. There is a band of increased signal at the pathologic L4 body fracture. No abnormal signal elsewhere. IMPRESSION: Activity at the known L4 pathologic body fracture. No indication of osseous metastasis  elsewhere. Electronically Signed   By: JMonte FantasiaM.D.   On: 09/18/2016 15:17   Dg C-arm 1-60 Min  Result Date: 09/16/2016 CLINICAL DATA:  L4-5 laminectomy/foraminectomy. EXAM: LUMBAR SPINE - 2-3 VIEW; DG C-ARM 61-120 MIN COMPARISON:  MRI lumbar spine 08/28/2016 FINDINGS: Two spot fluoroscopic images of the lumbar spine demonstrate a surgical instrument over the posterior elements at the L4-5 level. Known L4 compression fracture. Mild spondylosis of the spine. Disc space narrowing at the L5-S1 level. Surgical clips are present anterior to the spine likely from previous vascular surgery. IMPRESSION: Mild spondylosis of the lumbar spine with disc disease at the L5-S1 level. Known L4 compression fracture with surgical instrument over the posterior elements at the L4-5 level. Electronically Signed   By: DMarin OlpM.D.   On: 09/16/2016 18:54    Time Spent in minutes  25   DLouellen MolderM.D on 09/19/2016 at 1:34 PM  Between 7am to 7pm - Pager - [phone] After 7pm go to [email or web address] - password [facility] -  Office  [phone]

## 2016-09-19 NOTE — Progress Notes (Signed)
Received patient in the clinic following [facility] simulation. Patient accompanied by his wife of 48 years. Patient alert but, pleasantly confused. Systolic bp elevated. Patient denies pain. Patient is afebrile. Patient denies having a pacemaker or history of radiation therapy. Wife confirms he has neither. Patient discharged [facility] care of [facility] to [facility] to [facility] central, bed 18 in no distress.

## 2016-09-19 NOTE — Progress Notes (Signed)
Phoned [facility] to inquire if arrangements had been made for patient to be brought to [facility] rad onc for [facility] simulation. Unfortunately, arrangements have not been made. Phoned floor nurse, Vassie Moment. She committed to making necessary arrangements. Vassie Moment reports the patient is confused but, his wife is coming with him. Vassie Moment reports the patient has not complained of pain. Provided Quida with the number for [facility].

## 2016-09-19 NOTE — Progress Notes (Signed)
Called [facility] to give report and arrange transport for [facility] scheduled at 9:30 this am. Pt picked up at 9:15am. Pt alert, verbal with baseline confusion. He denied pain or discomfort. Medication administered per MD order. Wife at bedside. Pt will return after procedure.

## 2016-09-19 NOTE — Progress Notes (Signed)
Physical Therapy Treatment Patient Details Name: Zachary Mccarthy MRN: [id number] DOB: 1940-10-02 Today's Date: 09/19/2016    History of Present Illness Pt is a 76 y/o male s/p right L4-5 laminotomy/foraminotomy and L4 vertebral body biopsy. Pt has a past medical history of  Hyperlipidemia; Hypertension; Cataract extraction; and Vascular surgery. 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    PT Comments    Pt very agitated and confused this session and not willing to fully participate in OOB activity. PT reviewed log roll technique for bed mobility with pt, but pt became very agitated and not receptive to assistance. Pt would continue to benefit from skilled physical therapy services at this time while admitted and after d/c to address the below listed limitations in order to improve overall safety and independence with functional mobility.    Follow Up Recommendations  SNF;Supervision/Assistance - 24 hour     Equipment Recommendations  None recommended by PT    Recommendations for Other Services       Precautions / Restrictions Precautions Precautions: Fall;Back Precaution Booklet Issued: No Precaution Comments: pt did not recall any precautions or log roll technique for bed mobility. Pt's family denied hearing of back precautions. Restrictions Weight Bearing Restrictions: No    Mobility  Bed Mobility Overal bed mobility: Needs Assistance Bed Mobility: Supine to Sit;Sit to Supine     Supine to sit: Min guard Sit to supine: Min guard   General bed mobility comments: therapist attempted to assist pt and provided vc'ing for log roll technique, but pt became very agitated and sat straight up in the bed. Pt cursing at therapist and stating "you're just trying to trick me"  Transfers                 General transfer comment: pt would not agree to transfer  Ambulation/Gait                 Stairs            Wheelchair Mobility    Modified Rankin (Stroke Patients Only)       Balance                                            Cognition Arousal/Alertness: Awake/alert Behavior During Therapy: Agitated Overall Cognitive Status: Impaired/Different from baseline Area of Impairment: Memory;Following commands;Problem solving                     Memory: Decreased short-term memory;Decreased recall of precautions Following Commands: Follows one step commands consistently Safety/Judgement: Decreased awareness of safety;Decreased awareness of deficits   Problem Solving: Slow processing;Decreased initiation        Exercises      General Comments        Pertinent Vitals/Pain Pain Assessment: No/denies pain Pain Intervention(s): Limited activity within patient's tolerance    Home Living                      Prior Function            PT Goals (current goals can now be found in the care plan section) Acute Rehab PT Goals PT Goal Formulation: With patient/family Time For Goal Achievement: 10/01/16 Potential to Achieve  Goals: Fair Progress towards PT goals: Not progressing toward goals - comment (pt very agitated and not willing to participate further)    Frequency    Min 5X/week      PT Plan Current plan remains appropriate    Co-evaluation              AM-PAC PT "6 Clicks" Daily Activity  Outcome Measure  Difficulty turning over in bed (including adjusting bedclothes, sheets and blankets)?: A Little Difficulty moving from lying on back to sitting on the side of the bed? : A Lot Difficulty sitting down on and standing up from a chair with arms (e.g., wheelchair, bedside commode, etc,.)?: A Lot Help needed moving to and from a bed to chair (including a wheelchair)?: A Lot Help needed  walking in hospital room?: A Lot Help needed climbing 3-5 steps with a railing? : A Lot 6 Click Score: 13    End of Session   Activity Tolerance: Treatment limited secondary to agitation Patient left: in bed;with call bell/phone within reach;with bed alarm set;with family/visitor present Nurse Communication: Mobility status PT Visit Diagnosis: Unsteadiness on feet (R26.81);Muscle weakness (generalized) (M62.81);Difficulty in walking, not elsewhere classified (R26.2)     Time: 1188-6773 PT Time Calculation (min) (ACUTE ONLY): 10 min  Charges:  $Therapeutic Activity: 8-22 mins                    G Codes:       [place], [place], [place] [phone]    [facility] 09/19/2016, 3:57 PM

## 2016-09-19 NOTE — Progress Notes (Signed)
Plan is for SNF when medically ready. CM following.

## 2016-09-19 NOTE — Care Management Important Message (Signed)
Important Message  Patient Details  Name: Zachary Mccarthy MRN: [id number] Date of Birth: [birth year]   Medicare Important Message Given:  Yes    Orbie Pyo 09/19/2016, 2:13 PM

## 2016-09-20 DIAGNOSIS — C7951 Secondary malignant neoplasm of bone: Secondary | ICD-10-CM

## 2016-09-20 DIAGNOSIS — C3490 Malignant neoplasm of unspecified part of unspecified bronchus or lung: Secondary | ICD-10-CM

## 2016-09-20 DIAGNOSIS — C7931 Secondary malignant neoplasm of brain: Secondary | ICD-10-CM

## 2016-09-20 DIAGNOSIS — G9341 Metabolic encephalopathy: Secondary | ICD-10-CM

## 2016-09-20 NOTE — Progress Notes (Signed)
Patient ID: Zachary Mccarthy, male   DOB: [birth year], 76 y.o.   MRN: [id number] Patient doing well no complaints this morning  Neurologically stable  Physical occupational therapy awaiting preoperative SRS and craniotomy for resection of cerebellar metastases.

## 2016-09-20 NOTE — Progress Notes (Signed)
Zachary Mccarthy looks pretty good. He has his wife with him.  The pathology did come back. He has adenocarcinoma of the lung. This is no surprise.  The bone scan that he had done just showed the activity at L4.  I spoke to he and his wife. I think he was sleeping for most of this. I explained to her that we are dealing with a situation that is treatable but not curable. We need to focus on the brain. He is going to have a sedated MRI. He has the large right cerebellar med.  He is supposed to have Iatan and then have surgical resection.  Because this is adenocarcinoma, we will have to send off driver mutation studies. Hopefully they can get this from the material that was sent. Sometimes, bone biopsies are very difficult to obtain genetic markers. If this is the case, then would ever gets resected from his brain I think would provide ample tissue.  Again I talked to his wife at length. I explained her that we're looking at a situation that we just are not going to cure. The cancer was advanced too far for [place] to cure this.  He has been in good shape. If he has no "driver mutations" we might be overdue treat him with chemotherapy.  On his physical exam, the really is no change. He seems to have better strength in his right leg. I think that physical therapy is working with him.  I told his wife that it is very important that he maintain his nutrition and maintain his weight.  I answered all of her questions. She has a very strong faith.  We will continue to follow along. There is not much that we can do right now. We'll have to wait the results of his genetic studies. That may take a week or so before we get them.  Pete Halo Shevlin,MD  [place] 1:9

## 2016-09-20 NOTE — Progress Notes (Signed)
PROGRESS NOTE                                                                                                                                                                                                             Patient Demographics:    Zachary Mccarthy, is a 76 y.o. male, DOB - Mar 26, 1941, HKV:[id number]  Admit date - 09/15/2016   Admitting Physician [name], MD  Outpatient Primary MD for the patient is [name], MD  LOS - 5  Outpatient Specialists:  No chief complaint on file.      Brief Narrative   76 year old male wit history of hypertension, hyperlipidemia, chronic kidney disease stage III, mild dementia, known invasive bladder cancer status post TURBT (currently on surveillance) admitted to neurosurgery service for severe right-sided back pain radiating to the right thigh and knee, progressive for the past 5 months severely limiting his mobility. (He is now wheelchair bound). MRI of the spine showed pathologic L4 compression fracture. On 5/1 ,Underwent right L4-5 laminectomy and foraminotomy with decompression, had L4 vertebral body biopsy. CT of the chest shows large right perihilar and infrahilar mass with mediastinal adenopathy and small right pleural effusion with postobstructive right lobe consolidation. Also has calcified pulmonary nodules on the right. Hypodense lesions in the liver also found. CT of the head done given findings of pulmonary nodules and some confusion showed right posterior parietal lobe hemorrhagic metastases with some mass effect.  Hospitalist consulting for acute confusion and other medical issues.   Subjective:   Continues to be confused. He did not recognize me today.  Assessment  & Plan :   Pathological compression fracture of lumbar spine  secondary to metastasis from lung primary. Symptoms progressive over past 5 months associated with about 20 pound weight  loss. -PT evaluation. Pain controlled on current regimen.  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 -CT chest and head findings as above. - Vertebral biopsy shows poorly differential adenocarcinoma. 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 Bone scan shows activity only at L4 vertebral fracture. No other osseous metastases. -On Decadron for cerebral edema. -Oncology consult appreciated. SRS CT simulationdone -Primary team will schedule surgical resection of the cerebellar mass along with radiation therapy. Plan on MRI of  the brain with anesthesia, preop SRS next week followed by surgery.   Acute encephalopathy Patient pleasantly confused possibly due to brain metastases. Continue to monitor.  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 - S/p TURBT with negative cystoscopy on 06/03/2016 and has not had recurrence since 2015. - Followed by outpatient urology ([name].)    Essential hypertension Stable. Continue Norvasc and Coreg.    Hyperlipidemia Continue Zocor and Zetia.    Chronic kidney disease stage III Creatinine improved. (Baseline 1.5-1.9)       Code Status : Full code  Family Communication  : Wife at bedside  Disposition Plan  : Per primary team and further hospital course.    Procedures  :   right L4-5 laminectomy and foraminotomy with decompression,   L4 vertebral body biopsy. MRI brain Bone scan    DVT Prophylaxis  :  Lovenox -  Lab Results  Component Value Date   PLT 137 (L) 09/18/2016    Antibiotics  :    Anti-infectives    Start     Dose/Rate Route Frequency Ordered Stop   09/17/16 0000  ceFAZolin (ANCEF) IVPB 2g/100 mL premix     2 g 200 mL/hr over 30 Minutes Intravenous Every 8 hours 09/16/16 2015 09/17/16 0827   09/16/16 1700  bacitracin 50,000 Units in sodium  chloride irrigation 0.9 % 500 mL irrigation  Status:  Discontinued       As needed 09/16/16 1700 09/16/16 1835   09/16/16 1547  ceFAZolin (ANCEF) 2-4 GM/100ML-% IVPB    Comments:  Gallman, Kathie   : cabinet override      09/16/16 1547 09/17/16 0359        Objective:   Vitals:   09/19/16 2100 09/20/16 0128 09/20/16 0514 09/20/16 1006  BP: (!) 149/70 140/72 (!) 142/71 126/65  Pulse: 79 81 77 72  Resp: '18 18 18 19  '$ Temp: 99.2 F (37.3 C) 98.7 F (37.1 C) 98.8 F (37.1 C) 98.6 F (37 C)  TempSrc: Oral Oral Oral Oral  SpO2: 97% 98% 97% 99%  Weight:      Height:        Wt Readings from Last 3 Encounters:  09/16/16 66.8 kg (147 lb 4.8 oz)    No intake or output data in the 24 hours ending 09/20/16 1156   Physical Exam  Gen: Elderly male not in distress,Confused  HEENT:  moist mucosa, supple neck, Chest: Clear to auscultation CVS: N S1&S2, no murmurs,  GI: soft, NT, ND,  Musculoskeletal:Warm, no edema  KZS:WFUXNATF no focal deficit    Data Review:    CBC  Recent Labs Lab 09/15/16 1507 09/16/16 0509 09/18/16 0242  WBC 4.0 4.2 10.1  HGB 11.8* 12.1* 10.4*  HCT 36.4* 37.7* 31.7*  PLT 134* 147* 137*  MCV 82.7 82.0 81.9  MCH 26.8 26.3 26.9  MCHC 32.4 32.1 32.8  RDW 15.1 15.0 15.1  LYMPHSABS  --   --  0.7  MONOABS  --   --  0.3  EOSABS  --   --  0.0  BASOSABS  --   --  0.0    Chemistries   Recent Labs Lab 09/15/16 1507 09/16/16 0509 09/18/16 0242 09/19/16 0458  NA 137 137 133* 136  K 4.0 3.6 4.2 4.4  CL 105 105 103 106  CO2 24 24 20* 21*  GLUCOSE 114* 97 152* 129*  BUN 17 17 31* 32*  CREATININE 1.57* 1.44* 1.64* 1.41*  CALCIUM 9.2 9.1  8.3* 8.8*  AST '26 26 25  '$ --   ALT 16* 15* 8*  --   ALKPHOS 35* 38 32*  --   BILITOT 0.6 0.8 0.5  --    ------------------------------------------------------------------------------------------------------------------ No results for input(s): CHOL, HDL, LDLCALC, TRIG, CHOLHDL, LDLDIRECT in the last 72  hours.  Lab Results  Component Value Date   HGBA1C 6.5 (H) 09/15/2016   ------------------------------------------------------------------------------------------------------------------ No results for input(s): TSH, T4TOTAL, T3FREE, THYROIDAB in the last 72 hours.  Invalid input(s): FREET3 ------------------------------------------------------------------------------------------------------------------ No results for input(s): VITAMINB12, FOLATE, FERRITIN, TIBC, IRON, RETICCTPCT in the last 72 hours.  Coagulation profile  Recent Labs Lab 09/16/16 0509  INR 1.10    No results for input(s): DDIMER in the last 72 hours.  Cardiac Enzymes No results for input(s): CKMB, TROPONINI, MYOGLOBIN in the last 168 hours.  Invalid input(s): CK ------------------------------------------------------------------------------------------------------------------ No results found for: BNP  Inpatient Medications  Scheduled Meds: . amLODipine  10 mg Oral Daily  . carvedilol  25 mg Oral BID WC  . dexamethasone  4 mg Intravenous Q6H  . docusate sodium  100 mg Oral BID  . ezetimibe  10 mg Oral Daily  . feeding supplement (ENSURE ENLIVE)  237 mL Oral TID BM  . gabapentin  300 mg Oral TID  . pantoprazole  40 mg Oral QHS  . senna  1 tablet Oral BID  . simvastatin  20 mg Oral q1800  . sodium chloride flush  3 mL Intravenous Q12H   Continuous Infusions: . sodium chloride 75 mL/hr at 09/18/16 0136  . sodium chloride    . lactated ringers 50 mL/hr at 09/15/16 1813  . methocarbamol (ROBAXIN)  IV     PRN Meds:.bisacodyl, hydrALAZINE, HYDROcodone-acetaminophen, menthol-cetylpyridinium **OR** phenol, methocarbamol **OR** methocarbamol (ROBAXIN)  IV, morphine injection, ondansetron **OR** ondansetron (ZOFRAN) IV, polyethylene glycol, sodium chloride flush, sodium phosphate  Micro Results Recent Results (from the past 240 hour(s))  Surgical PCR screen     Status: None   Collection Time: 09/16/16   6:14 AM  Result Value Ref Range Status   MRSA, PCR NEGATIVE NEGATIVE Final   Staphylococcus aureus NEGATIVE NEGATIVE Final    Comment:        The Xpert SA Assay (FDA approved for NASAL specimens in patients over 10 years of age), is one component of a comprehensive surveillance program.  Test performance has been validated by [facility] for patients greater than or equal to 65 year old. It is not intended to diagnose infection nor to guide or monitor treatment.     Radiology Reports Dg Chest 2 View  Result Date: 09/15/2016 CLINICAL DATA:  Preoperative respiratory evaluation. EXAM: CHEST  2 VIEW COMPARISON:  None. FINDINGS: The cardio pericardial silhouette is enlarged. Scattered nodules right lung may represent granulomata in there appears to be calcified lymph nodes in the right hilum. There is right base collapse/consolidation with small right pleural effusion. The visualized bony structures of the thorax are intact. IMPRESSION: 1. Multiple right pulmonary nodules, likely granulomata, but CT chest without contrast recommended to confirm. 2. Right base collapse/consolidation with small right pleural effusion. 3. Enlargement of the cardiopericardial silhouette, likely cardiomegaly although pericardial effusion not excluded. Electronically Signed   By: Misty Stanley M.D.   On: 09/15/2016 20:32   Dg Lumbar Spine 2-3 Views  Result Date: 09/16/2016 CLINICAL DATA:  L4-5 laminectomy/foraminectomy. EXAM: LUMBAR SPINE - 2-3 VIEW; DG C-ARM 61-120 MIN COMPARISON:  MRI lumbar spine 08/28/2016 FINDINGS: Two spot fluoroscopic images of the lumbar spine demonstrate  a surgical instrument over the posterior elements at the L4-5 level. Known L4 compression fracture. Mild spondylosis of the spine. Disc space narrowing at the L5-S1 level. Surgical clips are present anterior to the spine likely from previous vascular surgery. IMPRESSION: Mild spondylosis of the lumbar spine with disc disease at the L5-S1  level. Known L4 compression fracture with surgical instrument over the posterior elements at the L4-5 level. Electronically Signed   By: Marin Olp M.D.   On: 09/16/2016 18:54   Ct Head Wo Contrast  Result Date: 09/16/2016 CLINICAL DATA:  Pulmonary nodules, confusion EXAM: CT HEAD WITHOUT CONTRAST TECHNIQUE: Contiguous axial images were obtained from the base of the skull through the vertex without intravenous contrast. COMPARISON:  Same day chest CT FINDINGS: The study is compromised by motion artifacts. Brain: There is an ill-defined small right posterior parietal intra-axial hyperdensity seen, series 3, image 20 with an approximate 3.5 cm hypodense masslike abnormality in the right cerebellar hemisphere causing positive mass effect on the fourth ventricle. Findings are concerning for metastatic disease and edema given recent findings within the chest CT. More confluent areas of hypodensity are seen in the left frontal white matter likely representing transependymal spread of CSF. No extra-axial fluid collections are identified. Postobstructive dilatation on the lateral and third ventricles from mass effect on the fourth ventricle is noted. Vascular: No hyperdense vessel or unexpected calcification. Skull: Negative for focal lesions.  No fracture. Sinuses/Orbits: Limited by motion artifacts.  No acute finding. Other: None IMPRESSION: Intra-axial ill-defined hyperdensity in the posterior right parietal lobe may represent a small hemorrhagic metastasis. An additional 3.5 cm hypodense mass with adjacent edema in the right cerebellar hemisphere with positive mass effect on the fourth ventricle is also noted. These in conjunction CT chest findings are concerning for metastatic disease. MRI may help for further correlation. Postobstructive dilatation of the lateral third ventricles secondary to mass-effect of the fourth ventricle with transependymal spread of CSF in the left frontal lobe. Critical Value/emergent  results were called by telephone at the time of interpretation on 09/16/2016 at 11:29 pm to Nurse Elita Boone, who verbally acknowledged these results and will be contacting the oncall Neurosurgeon immediately. Electronically Signed   By: Ashley Royalty M.D.   On: 09/16/2016 23:29   Ct Chest Wo Contrast  Result Date: 09/16/2016 CLINICAL DATA:  Confusion, pulmonary nodules EXAM: CT CHEST WITHOUT CONTRAST TECHNIQUE: Multidetector CT imaging of the chest was performed following the standard protocol without IV contrast. COMPARISON:  None. FINDINGS: Cardiovascular: Cardiomegaly with small pericardial effusion and coronary arteriosclerosis. There is aortic atherosclerosis with aneurysmal dilatation of the ascending aorta up to 4.2 cm. There is dilatation of the main pulmonary artery to 3.9 cm consistent with a component of pulmonary hypertension. Atherosclerosis in the proximal great vessels is identified. Mediastinum/Nodes: Necrotic appearing cervical adenopathy is seen at the base of the neck on the left with central areas of hypodensity. Prevascular adenopathy measuring up to 1.5 cm short axis Left lower paratracheal 1.7 cm short axis lymphadenopathy is noted. The trachea is patent. The left mainstem bronchus is patent. There is narrowing of the right mainstem bronchus and bronchi to the right middle and lower lobes secondary to a mass about the right hilum described below. Lungs/Pleura: There is a large masslike abnormality in the infrahilar right hemithorax abutting the right heart border and measuring roughly 11 x 7.3 x 5.8 cm in AP by transverse by CC dimension. Exact margins are difficult to distinguish given lack of IV contrast  and also from postobstructive changes to the right middle and lower lobes. Additionally there are calcified nodules in the right upper, middle and lower lobes consistent granulomas. Right upper lobe 4.1 x 3.1 mm noncalcified nodule, series 5, image 31, oblong 8.1 x 2.9 mm nodule series  5, image 35, posterior segment right upper lobe 5.3 mm nodule series 5, image 70, superior segment right lower lobe series 5, image 83 4.6 mm nodules are also seen potentially representing metastatic nodules or noncalcified granulomas in light of the above findings. Small right pleural effusion is present. Upper Abdomen: Calcified granuloma in the right middle lobe. Indeterminate 12 mm hypodense lesion in the left hepatic lobe series 4, image 165 and the right hepatic dome mm hypodensity, series 4, image 129 are noted. Mild thickening of the adrenal glands. Aortic and branch vessel atherosclerosis is identified within the upper abdomen. There is mild fullness of both renal collecting systems. Musculoskeletal: No suspicious lytic or blastic disease. IMPRESSION: 1. Large right perihilar and infrahilar masslike abnormality measuring 11 x 7.3 x 5.8 cm with mediastinal adenopathy. Associated small right pleural effusion with postobstructive consolidations in the right lower lobe. Findings are concerning for bronchogenic carcinoma with metastatic adenopathy. There appears be cervical necrotic adenopathy at the base of the neck on the left for which CT or MRI with IV contrast is also recommended for better assessment. 2. Calcified and noncalcified subcentimeter pulmonary nodules are noted predominantly within the right lung. A calcified nodules may simply reflect granulomas however the noncalcified nodules cannot exclude metastatic pulmonary nodules. 3. Indeterminate hypodense lesions in the liver measuring 7 mm on the right and 12 mm on the left. 4. Aortic atherosclerosis.  Coronary arteriosclerosis. 5. 4.2 cm ascending aortic aneurysm. Recommend annual imaging followup by CTA or MRA. This recommendation follows 2010 ACCF/AHA/AATS/ACR/ASA/SCA/SCAI/SIR/STS/SVM Guidelines for the Diagnosis and Management of Patients with Thoracic Aortic Disease. Circulation. 2010; 121: Z610-R604 Electronically Signed   By: Ashley Royalty M.D.    On: 09/16/2016 23:11   Mr Jeri Cos VW Contrast  Addendum Date: 09/19/2016   ADDENDUM REPORT: 09/19/2016 21:59 ADDENDUM: Coronal and axial reformations at the sagittal CUBE now submitted. Electronically Signed   By: Elon Alas M.D.   On: 09/19/2016 21:59   Result Date: 09/19/2016 CLINICAL DATA:  Multiple brain metastasis, here for pre-surgical planning. EXAM: MRI HEAD WITHOUT AND WITH CONTRAST TECHNIQUE: Multiplanar, multiecho pulse sequences of the brain and surrounding structures were obtained without and with intravenous contrast. Coronal T2, sagittal and coronal T1 post gadolinium not obtained. CONTRAST:  29m MULTIHANCE GADOBENATE DIMEGLUMINE 529 MG/ML IV SOLN COMPARISON:  CT HEAD Sep 16, 2016 FINDINGS: Multiple sequences are moderately or severely motion degraded. Per technologist note, patient was confused and had difficulty remaining still and following directions. Maximum medications given and multiple sequences re-attempted. Severely motion degraded post gadolinium axial T1 sequences. INTRACRANIAL CONTENTS: Due to motion and lack of contrast, multiple intracranial metastasis or difficult to characterize in measurements are approximations. Multiple complex heterogeneous metastasis with susceptibility artifact consistent with blood products in T2 bright surrounding vasogenic edema as follows: 4.8 x 4.9 cm RIGHT cerebellum, 13 x 14 mm LEFT cerebellum, 5 mm LEFT cerebellum 11 x 9 mm RIGHT temporal lobe, 19 x 17 mm RIGHT occipital lobe, 13 x 18 mm LEFT inferior frontal lobe. 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 Mild upwards and inferior cerebellar herniation. Mildly effaced  pre pontine cistern. No supratentorial midline shift. A few additional scattered micro hemorrhages could represent metastasis or sequelae of chronic hypertension. Mild additional  presumed chronic small vessel ischemic disease. No abnormal extra-axial fluid collections. VASCULAR: Normal major intracranial vascular flow voids present at skull base. SKULL AND UPPER CERVICAL SPINE: No abnormal sellar expansion. No suspicious calvarial bone marrow signal. Craniocervical junction maintained. Severe degenerative change of the included cervical spine SINUSES/ORBITS: Small LEFT maxillary mucosal retention cyst without paranasal sinus air-fluid levels. Mastoid air cells are well aerated. The included ocular globes and orbital contents are non-suspicious. Status post bilateral ocular lens implants. OTHER: Patient is edentulous. No abnormal extra-axial fluid collections. IMPRESSION: Limited, incomplete MRI of the brain, nondiagnostic post gadolinium axial T1 sequences, limiting detection of metastatic disease. At least 3 infratentorial and 3 supratentorial hemorrhagic metastasis (additional metastasis may be present). Dominant 4.8 x 4.9 cm RIGHT cerebellar metastasis results in moderate to severe obstructive hydrocephalus. These results will be called to the ordering clinician or representative by the Radiologist Assistant, and communication documented in the [employer] Dashboard. Electronically Signed: By: Elon Alas M.D. On: 09/18/2016 22:58   Nm Bone Scan Whole Body  Result Date: 09/18/2016 CLINICAL DATA:  Bronchogenic carcinoma.  L4 compression fracture. EXAM: NUCLEAR MEDICINE WHOLE BODY BONE SCAN TECHNIQUE: Whole body anterior and posterior images were obtained approximately 3 hours after intravenous injection of radiopharmaceutical. RADIOPHARMACEUTICALS:  25 MCi Technetium-51mMDP IV COMPARISON:  Lumbar spine MRI 08/28/2016 FINDINGS: Bladder and bilateral renal activity is present. There is a band of increased signal at the pathologic L4 body fracture. No abnormal signal elsewhere. IMPRESSION: Activity at the known L4 pathologic body fracture. No indication of osseous metastasis elsewhere.  Electronically Signed   By: JMonte FantasiaM.D.   On: 09/18/2016 15:17   Dg C-arm 1-60 Min  Result Date: 09/16/2016 CLINICAL DATA:  L4-5 laminectomy/foraminectomy. EXAM: LUMBAR SPINE - 2-3 VIEW; DG C-ARM 61-120 MIN COMPARISON:  MRI lumbar spine 08/28/2016 FINDINGS: Two spot fluoroscopic images of the lumbar spine demonstrate a surgical instrument over the posterior elements at the L4-5 level. Known L4 compression fracture. Mild spondylosis of the spine. Disc space narrowing at the L5-S1 level. Surgical clips are present anterior to the spine likely from previous vascular surgery. IMPRESSION: Mild spondylosis of the lumbar spine with disc disease at the L5-S1 level. Known L4 compression fracture with surgical instrument over the posterior elements at the L4-5 level. Electronically Signed   By: DMarin OlpM.D.   On: 09/16/2016 18:54    Time Spent in minutes  25   DLouellen MolderM.D on 09/20/2016 at 11:56 AM  Between 7am to 7pm - Pager - [phone] After 7pm go to [email or web address] - password [email or web address] -  Office  [phone]

## 2016-09-21 NOTE — Progress Notes (Signed)
Patient ID: Zachary Mccarthy, male   DOB: 1940/06/28, 76 y.o.   MRN: [id number] Subjective:  The patient is alert and pleasant. His wife is at the bedside.  Objective: Vital signs in last 24 hours: Temp:  [97.6 F (36.4 C)-98.9 F (37.2 C)] 97.6 F (36.4 C) (05/06 0836) Pulse Rate:  [73-86] 76 (05/06 0836) Resp:  [18-20] 20 (05/06 0836) BP: (120-161)/(49-80) 155/66 (05/06 0836) SpO2:  [93 %-100 %] 100 % (05/06 0836)  Intake/Output from previous day: No intake/output data recorded. Intake/Output this shift: Total I/O In: 240 [P.O.:240] Out: -   Physical exam the patient is alert and pleasant. He is moving all 4 extremities well.  Lab Results: No results for input(s): WBC, HGB, HCT, PLT in the last 72 hours. BMET  Recent Labs  09/19/16 0458  NA 136  K 4.4  CL 106  CO2 21*  GLUCOSE 129*  BUN 32*  CREATININE 1.41*  CALCIUM 8.8*    Studies/Results: No results found.  Assessment/Plan: Brain metastasis: He is going to get a MRI with general anesthesia tomorrow.  He is recovering from the back surgery well.  LOS: 6 days     Katoria Yetman D 09/21/2016, 10:32 AM

## 2016-09-21 NOTE — Progress Notes (Signed)
PROGRESS NOTE                                                                                                                                                                                                             Patient Demographics:    Zachary Mccarthy, is a 76 y.o. male, DOB - Apr 30, 1941, PIR:[id number]  Admit date - 09/15/2016   Admitting Physician [name], MD  Outpatient Primary MD for the patient is [name], MD  LOS - 6  Outpatient Specialists:  No chief complaint on file.      Brief Narrative   76 year old male wit history of hypertension, hyperlipidemia, chronic kidney disease stage III, mild dementia, known invasive bladder cancer status post TURBT (currently on surveillance) admitted to neurosurgery service for severe right-sided back pain radiating to the right thigh and knee, progressive for the past 5 months severely limiting his mobility. (He is now wheelchair bound). MRI of the spine showed pathologic L4 compression fracture. On 5/1 ,Underwent right L4-5 laminectomy and foraminotomy with decompression, had L4 vertebral body biopsy. CT of the chest shows large right perihilar and infrahilar mass with mediastinal adenopathy and small right pleural effusion with postobstructive right lobe consolidation. Also has calcified pulmonary nodules on the right. Hypodense lesions in the liver also found. CT of the head done given findings of pulmonary nodules and some confusion showed right posterior parietal lobe hemorrhagic metastases with some mass effect.  Hospitalist consulting for acute confusion and other medical issues.   Subjective:   Continues to be confused. He did not recognize me today.  Assessment  & Plan :   Pathological compression fracture of lumbar spine  secondary to metastasis from lung primary. Symptoms progressive over past 5 months associated with about 20 pound weight  loss. -PT evaluation. Pain controlled on current regimen.  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 -CT chest and head findings as above. -  poorly differential adenocarcinoma on vertebral biopsy. 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 Bone scan shows activity only at L4 vertebral fracture. No other osseous metastases. -On Decadron for cerebral edema. -Oncology consult appreciated. SRS CT simulation done. -Primary team will schedule surgical resection of the cerebellar mass along with radiation therapy. Plan on MRI of the brain  with anesthesia tomorrow, preop SRS followed by surgical resection of the brain has   Acute encephalopathy Patient pleasantly confused possibly due to brain metastases. Continue to monitor.  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 - S/p TURBT with negative cystoscopy on 06/03/2016 and has not had recurrence since 2015. - Followed by outpatient urology ([name].)    Essential hypertension Stable. Continue Norvasc and Coreg.    Hyperlipidemia Continue Zocor and Zetia.    Chronic kidney disease stage III Creatinine improved. (Baseline 1.5-1.9)       Code Status : Full code  Family Communication  : Wife at bedside  Disposition Plan  : Per primary team and further hospital course.    Procedures  :   right L4-5 laminectomy and foraminotomy with decompression,   L4 vertebral body biopsy. MRI brain Bone scan    DVT Prophylaxis  :  Lovenox -  Lab Results  Component Value Date   PLT 137 (L) 09/18/2016    Antibiotics  :    Anti-infectives    Start     Dose/Rate Route Frequency Ordered Stop   09/17/16 0000  ceFAZolin (ANCEF) IVPB 2g/100 mL premix     2 g 200 mL/hr over 30 Minutes Intravenous Every 8 hours 09/16/16 2015 09/17/16 0827   09/16/16 1700  bacitracin 50,000 Units in sodium  chloride irrigation 0.9 % 500 mL irrigation  Status:  Discontinued       As needed 09/16/16 1700 09/16/16 1835   09/16/16 1547  ceFAZolin (ANCEF) 2-4 GM/100ML-% IVPB    Comments:  Gallman, Kathie   : cabinet override      09/16/16 1547 09/17/16 0359        Objective:   Vitals:   09/20/16 2136 09/21/16 0100 09/21/16 0600 09/21/16 0836  BP: 138/65 (!) 148/80 (!) 161/80 (!) 155/66  Pulse: 73 86 78 76  Resp: '20 20 20 20  '$ Temp: 98.5 F (36.9 C) 98.9 F (37.2 C) 98.1 F (36.7 C) 97.6 F (36.4 C)  TempSrc: Oral Oral Oral Oral  SpO2: 97% 93% 98% 100%  Weight:      Height:        Wt Readings from Last 3 Encounters:  09/16/16 66.8 kg (147 lb 4.8 oz)     Intake/Output Summary (Last 24 hours) at 09/21/16 1107 Last data filed at 09/21/16 0837  Gross per 24 hour  Intake              240 ml  Output                0 ml  Net              240 ml     Physical Exam  Gen: Distress, remains confused HEENT:  moist mucosa, supple neck, Chest: Clear to auscultation CVS: N S1&S2, no murmurs,  GI: soft, NT, ND,  Musculoskeletal:Warm, no edema  QQP:YPPJKDTO , no focal deficit    Data Review:    CBC  Recent Labs Lab 09/15/16 1507 09/16/16 0509 09/18/16 0242  WBC 4.0 4.2 10.1  HGB 11.8* 12.1* 10.4*  HCT 36.4* 37.7* 31.7*  PLT 134* 147* 137*  MCV 82.7 82.0 81.9  MCH 26.8 26.3 26.9  MCHC 32.4 32.1 32.8  RDW 15.1 15.0 15.1  LYMPHSABS  --   --  0.7  MONOABS  --   --  0.3  EOSABS  --   --  0.0  BASOSABS  --   --  0.0    Chemistries   Recent Labs Lab 09/15/16 1507 09/16/16 0509 09/18/16 0242 09/19/16 0458  NA 137 137 133* 136  K 4.0 3.6 4.2 4.4  CL 105 105 103 106  CO2 24 24 20* 21*  GLUCOSE 114* 97 152* 129*  BUN 17 17 31* 32*  CREATININE 1.57* 1.44* 1.64* 1.41*  CALCIUM 9.2 9.1 8.3* 8.8*  AST '26 26 25  '$ --   ALT 16* 15* 8*  --   ALKPHOS 35* 38 32*  --   BILITOT 0.6 0.8 0.5  --     ------------------------------------------------------------------------------------------------------------------ No results for input(s): CHOL, HDL, LDLCALC, TRIG, CHOLHDL, LDLDIRECT in the last 72 hours.  Lab Results  Component Value Date   HGBA1C 6.5 (H) 09/15/2016   ------------------------------------------------------------------------------------------------------------------ No results for input(s): TSH, T4TOTAL, T3FREE, THYROIDAB in the last 72 hours.  Invalid input(s): FREET3 ------------------------------------------------------------------------------------------------------------------ No results for input(s): VITAMINB12, FOLATE, FERRITIN, TIBC, IRON, RETICCTPCT in the last 72 hours.  Coagulation profile  Recent Labs Lab 09/16/16 0509  INR 1.10    No results for input(s): DDIMER in the last 72 hours.  Cardiac Enzymes No results for input(s): CKMB, TROPONINI, MYOGLOBIN in the last 168 hours.  Invalid input(s): CK ------------------------------------------------------------------------------------------------------------------ No results found for: BNP  Inpatient Medications  Scheduled Meds: . amLODipine  10 mg Oral Daily  . carvedilol  25 mg Oral BID WC  . dexamethasone  4 mg Intravenous Q6H  . docusate sodium  100 mg Oral BID  . ezetimibe  10 mg Oral Daily  . feeding supplement (ENSURE ENLIVE)  237 mL Oral TID BM  . gabapentin  300 mg Oral TID  . pantoprazole  40 mg Oral QHS  . senna  1 tablet Oral BID  . simvastatin  20 mg Oral q1800  . sodium chloride flush  3 mL Intravenous Q12H   Continuous Infusions: . sodium chloride 75 mL/hr at 09/18/16 0136  . sodium chloride    . lactated ringers 50 mL/hr at 09/15/16 1813  . methocarbamol (ROBAXIN)  IV     PRN Meds:.bisacodyl, hydrALAZINE, HYDROcodone-acetaminophen, menthol-cetylpyridinium **OR** phenol, methocarbamol **OR** methocarbamol (ROBAXIN)  IV, morphine injection, ondansetron **OR** ondansetron  (ZOFRAN) IV, polyethylene glycol, sodium chloride flush, sodium phosphate  Micro Results Recent Results (from the past 240 hour(s))  Surgical PCR screen     Status: None   Collection Time: 09/16/16  6:14 AM  Result Value Ref Range Status   MRSA, PCR NEGATIVE NEGATIVE Final   Staphylococcus aureus NEGATIVE NEGATIVE Final    Comment:        The Xpert SA Assay (FDA approved for NASAL specimens in patients over 75 years of age), is one component of a comprehensive surveillance program.  Test performance has been validated by [facility] for patients greater than or equal to 80 year old. It is not intended to diagnose infection nor to guide or monitor treatment.     Radiology Reports Dg Chest 2 View  Result Date: 09/15/2016 CLINICAL DATA:  Preoperative respiratory evaluation. EXAM: CHEST  2 VIEW COMPARISON:  None. FINDINGS: The cardio pericardial silhouette is enlarged. Scattered nodules right lung may represent granulomata in there appears to be calcified lymph nodes in the right hilum. There is right base collapse/consolidation with small right pleural effusion. The visualized bony structures of the thorax are intact. IMPRESSION: 1. Multiple right pulmonary nodules, likely granulomata, but CT chest without contrast recommended to confirm. 2. Right base collapse/consolidation with small right pleural effusion. 3. Enlargement of the cardiopericardial  silhouette, likely cardiomegaly although pericardial effusion not excluded. Electronically Signed   By: Misty Stanley M.D.   On: 09/15/2016 20:32   Dg Lumbar Spine 2-3 Views  Result Date: 09/16/2016 CLINICAL DATA:  L4-5 laminectomy/foraminectomy. EXAM: LUMBAR SPINE - 2-3 VIEW; DG C-ARM 61-120 MIN COMPARISON:  MRI lumbar spine 08/28/2016 FINDINGS: Two spot fluoroscopic images of the lumbar spine demonstrate a surgical instrument over the posterior elements at the L4-5 level. Known L4 compression fracture. Mild spondylosis of the spine. Disc space  narrowing at the L5-S1 level. Surgical clips are present anterior to the spine likely from previous vascular surgery. IMPRESSION: Mild spondylosis of the lumbar spine with disc disease at the L5-S1 level. Known L4 compression fracture with surgical instrument over the posterior elements at the L4-5 level. Electronically Signed   By: Marin Olp M.D.   On: 09/16/2016 18:54   Ct Head Wo Contrast  Result Date: 09/16/2016 CLINICAL DATA:  Pulmonary nodules, confusion EXAM: CT HEAD WITHOUT CONTRAST TECHNIQUE: Contiguous axial images were obtained from the base of the skull through the vertex without intravenous contrast. COMPARISON:  Same day chest CT FINDINGS: The study is compromised by motion artifacts. Brain: There is an ill-defined small right posterior parietal intra-axial hyperdensity seen, series 3, image 20 with an approximate 3.5 cm hypodense masslike abnormality in the right cerebellar hemisphere causing positive mass effect on the fourth ventricle. Findings are concerning for metastatic disease and edema given recent findings within the chest CT. More confluent areas of hypodensity are seen in the left frontal white matter likely representing transependymal spread of CSF. No extra-axial fluid collections are identified. Postobstructive dilatation on the lateral and third ventricles from mass effect on the fourth ventricle is noted. Vascular: No hyperdense vessel or unexpected calcification. Skull: Negative for focal lesions.  No fracture. Sinuses/Orbits: Limited by motion artifacts.  No acute finding. Other: None IMPRESSION: Intra-axial ill-defined hyperdensity in the posterior right parietal lobe may represent a small hemorrhagic metastasis. An additional 3.5 cm hypodense mass with adjacent edema in the right cerebellar hemisphere with positive mass effect on the fourth ventricle is also noted. These in conjunction CT chest findings are concerning for metastatic disease. MRI may help for further  correlation. Postobstructive dilatation of the lateral third ventricles secondary to mass-effect of the fourth ventricle with transependymal spread of CSF in the left frontal lobe. Critical Value/emergent results were called by telephone at the time of interpretation on 09/16/2016 at 11:29 pm to Nurse Elita Boone, who verbally acknowledged these results and will be contacting the oncall Neurosurgeon immediately. Electronically Signed   By: Ashley Royalty M.D.   On: 09/16/2016 23:29   Ct Chest Wo Contrast  Result Date: 09/16/2016 CLINICAL DATA:  Confusion, pulmonary nodules EXAM: CT CHEST WITHOUT CONTRAST TECHNIQUE: Multidetector CT imaging of the chest was performed following the standard protocol without IV contrast. COMPARISON:  None. FINDINGS: Cardiovascular: Cardiomegaly with small pericardial effusion and coronary arteriosclerosis. There is aortic atherosclerosis with aneurysmal dilatation of the ascending aorta up to 4.2 cm. There is dilatation of the main pulmonary artery to 3.9 cm consistent with a component of pulmonary hypertension. Atherosclerosis in the proximal great vessels is identified. Mediastinum/Nodes: Necrotic appearing cervical adenopathy is seen at the base of the neck on the left with central areas of hypodensity. Prevascular adenopathy measuring up to 1.5 cm short axis Left lower paratracheal 1.7 cm short axis lymphadenopathy is noted. The trachea is patent. The left mainstem bronchus is patent. There is narrowing of the right mainstem  bronchus and bronchi to the right middle and lower lobes secondary to a mass about the right hilum described below. Lungs/Pleura: There is a large masslike abnormality in the infrahilar right hemithorax abutting the right heart border and measuring roughly 11 x 7.3 x 5.8 cm in AP by transverse by CC dimension. Exact margins are difficult to distinguish given lack of IV contrast and also from postobstructive changes to the right middle and lower lobes.  Additionally there are calcified nodules in the right upper, middle and lower lobes consistent granulomas. Right upper lobe 4.1 x 3.1 mm noncalcified nodule, series 5, image 31, oblong 8.1 x 2.9 mm nodule series 5, image 35, posterior segment right upper lobe 5.3 mm nodule series 5, image 70, superior segment right lower lobe series 5, image 83 4.6 mm nodules are also seen potentially representing metastatic nodules or noncalcified granulomas in light of the above findings. Small right pleural effusion is present. Upper Abdomen: Calcified granuloma in the right middle lobe. Indeterminate 12 mm hypodense lesion in the left hepatic lobe series 4, image 165 and the right hepatic dome mm hypodensity, series 4, image 129 are noted. Mild thickening of the adrenal glands. Aortic and branch vessel atherosclerosis is identified within the upper abdomen. There is mild fullness of both renal collecting systems. Musculoskeletal: No suspicious lytic or blastic disease. IMPRESSION: 1. Large right perihilar and infrahilar masslike abnormality measuring 11 x 7.3 x 5.8 cm with mediastinal adenopathy. Associated small right pleural effusion with postobstructive consolidations in the right lower lobe. Findings are concerning for bronchogenic carcinoma with metastatic adenopathy. There appears be cervical necrotic adenopathy at the base of the neck on the left for which CT or MRI with IV contrast is also recommended for better assessment. 2. Calcified and noncalcified subcentimeter pulmonary nodules are noted predominantly within the right lung. A calcified nodules may simply reflect granulomas however the noncalcified nodules cannot exclude metastatic pulmonary nodules. 3. Indeterminate hypodense lesions in the liver measuring 7 mm on the right and 12 mm on the left. 4. Aortic atherosclerosis.  Coronary arteriosclerosis. 5. 4.2 cm ascending aortic aneurysm. Recommend annual imaging followup by CTA or MRA. This recommendation follows  2010 ACCF/AHA/AATS/ACR/ASA/SCA/SCAI/SIR/STS/SVM Guidelines for the Diagnosis and Management of Patients with Thoracic Aortic Disease. Circulation. 2010; 121: [id number] Electronically Signed   By: Ashley Royalty M.D.   On: 09/16/2016 23:11   Mr Jeri Cos ON Contrast  Addendum Date: 09/19/2016   ADDENDUM REPORT: 09/19/2016 21:59 ADDENDUM: Coronal and axial reformations at the sagittal CUBE now submitted. Electronically Signed   By: Elon Alas M.D.   On: 09/19/2016 21:59   Result Date: 09/19/2016 CLINICAL DATA:  Multiple brain metastasis, here for pre-surgical planning. EXAM: MRI HEAD WITHOUT AND WITH CONTRAST TECHNIQUE: Multiplanar, multiecho pulse sequences of the brain and surrounding structures were obtained without and with intravenous contrast. Coronal T2, sagittal and coronal T1 post gadolinium not obtained. CONTRAST:  90m MULTIHANCE GADOBENATE DIMEGLUMINE 529 MG/ML IV SOLN COMPARISON:  CT HEAD Sep 16, 2016 FINDINGS: Multiple sequences are moderately or severely motion degraded. Per technologist note, patient was confused and had difficulty remaining still and following directions. Maximum medications given and multiple sequences re-attempted. Severely motion degraded post gadolinium axial T1 sequences. INTRACRANIAL CONTENTS: Due to motion and lack of contrast, multiple intracranial metastasis or difficult to characterize in measurements are approximations. Multiple complex heterogeneous metastasis with susceptibility artifact consistent with blood products in T2 bright surrounding vasogenic edema as follows: 4.8 x 4.9 cm RIGHT cerebellum, 13 x 14  mm LEFT cerebellum, 5 mm LEFT cerebellum 11 x 9 mm RIGHT temporal lobe, 19 x 17 mm RIGHT occipital lobe, 13 x 18 mm LEFT inferior frontal lobe. 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 Mild upwards and inferior  cerebellar herniation. Mildly effaced pre pontine cistern. No supratentorial midline shift. A few additional scattered micro hemorrhages could represent metastasis or sequelae of chronic hypertension. Mild additional presumed chronic small vessel ischemic disease. No abnormal extra-axial fluid collections. VASCULAR: Normal major intracranial vascular flow voids present at skull base. SKULL AND UPPER CERVICAL SPINE: No abnormal sellar expansion. No suspicious calvarial bone marrow signal. Craniocervical junction maintained. Severe degenerative change of the included cervical spine SINUSES/ORBITS: Small LEFT maxillary mucosal retention cyst without paranasal sinus air-fluid levels. Mastoid air cells are well aerated. The included ocular globes and orbital contents are non-suspicious. Status post bilateral ocular lens implants. OTHER: Patient is edentulous. No abnormal extra-axial fluid collections. IMPRESSION: Limited, incomplete MRI of the brain, nondiagnostic post gadolinium axial T1 sequences, limiting detection of metastatic disease. At least 3 infratentorial and 3 supratentorial hemorrhagic metastasis (additional metastasis may be present). Dominant 4.8 x 4.9 cm RIGHT cerebellar metastasis results in moderate to severe obstructive hydrocephalus. These results will be called to the ordering clinician or representative by the Radiologist Assistant, and communication documented in the [employer] Dashboard. Electronically Signed: By: Elon Alas M.D. On: 09/18/2016 22:58   Nm Bone Scan Whole Body  Result Date: 09/18/2016 CLINICAL DATA:  Bronchogenic carcinoma.  L4 compression fracture. EXAM: NUCLEAR MEDICINE WHOLE BODY BONE SCAN TECHNIQUE: Whole body anterior and posterior images were obtained approximately 3 hours after intravenous injection of radiopharmaceutical. RADIOPHARMACEUTICALS:  25 MCi Technetium-38mMDP IV COMPARISON:  Lumbar spine MRI 08/28/2016 FINDINGS: Bladder and bilateral renal activity is  present. There is a band of increased signal at the pathologic L4 body fracture. No abnormal signal elsewhere. IMPRESSION: Activity at the known L4 pathologic body fracture. No indication of osseous metastasis elsewhere. Electronically Signed   By: JMonte FantasiaM.D.   On: 09/18/2016 15:17   Dg C-arm 1-60 Min  Result Date: 09/16/2016 CLINICAL DATA:  L4-5 laminectomy/foraminectomy. EXAM: LUMBAR SPINE - 2-3 VIEW; DG C-ARM 61-120 MIN COMPARISON:  MRI lumbar spine 08/28/2016 FINDINGS: Two spot fluoroscopic images of the lumbar spine demonstrate a surgical instrument over the posterior elements at the L4-5 level. Known L4 compression fracture. Mild spondylosis of the spine. Disc space narrowing at the L5-S1 level. Surgical clips are present anterior to the spine likely from previous vascular surgery. IMPRESSION: Mild spondylosis of the lumbar spine with disc disease at the L5-S1 level. Known L4 compression fracture with surgical instrument over the posterior elements at the L4-5 level. Electronically Signed   By: DMarin OlpM.D.   On: 09/16/2016 18:54    Time Spent in minutes  25   DLouellen MolderM.D on 09/21/2016 at 11:07 AM  Between 7am to 7pm - Pager - [phone] After 7pm go to [email or web address] - password [email or web address] -  Office  [phone]

## 2016-09-22 ENCOUNTER — Other Ambulatory Visit: Payer: Self-pay | Admitting: Neurosurgery

## 2016-09-22 ENCOUNTER — Encounter (HOSPITAL_COMMUNITY)
Admission: AD | Disposition: A | Discharge status: Hospice | Payer: Self-pay | Source: Ambulatory Visit | Attending: Neurosurgery

## 2016-09-22 ENCOUNTER — Inpatient Hospital Stay (HOSPITAL_COMMUNITY): Payer: Medicare Other | Admitting: Anesthesiology

## 2016-09-22 ENCOUNTER — Encounter (HOSPITAL_COMMUNITY): Payer: Self-pay | Admitting: Anesthesiology

## 2016-09-22 ENCOUNTER — Inpatient Hospital Stay (HOSPITAL_COMMUNITY): Payer: Medicare Other

## 2016-09-22 HISTORY — PX: RADIOLOGY WITH ANESTHESIA: SHX6223

## 2016-09-22 LAB — BASIC METABOLIC PANEL
ANION GAP: 8 (ref 5–15)
BUN: 41 mg/dL — ABNORMAL HIGH (ref 6–20)
CALCIUM: 8.1 mg/dL — AB (ref 8.9–10.3)
CHLORIDE: 102 mmol/L (ref 101–111)
CO2: 22 mmol/L (ref 22–32)
Creatinine, Ser: 1.45 mg/dL — ABNORMAL HIGH (ref 0.61–1.24)
GFR calc Af Amer: 52 mL/min — ABNORMAL LOW (ref >60)
GFR calc non Af Amer: 45 mL/min — ABNORMAL LOW (ref >60)
GLUCOSE: 181 mg/dL — AB (ref 65–99)
Potassium: 4.1 mmol/L (ref 3.5–5.1)
Sodium: 132 mmol/L — ABNORMAL LOW (ref 135–145)

## 2016-09-22 LAB — CBC
HEMATOCRIT: 33.8 % — AB (ref 39.0–52.0)
HEMOGLOBIN: 11 g/dL — AB (ref 13.0–17.0)
MCH: 26.5 pg (ref 26.0–34.0)
MCHC: 32.5 g/dL (ref 30.0–36.0)
MCV: 81.4 fL (ref 78.0–100.0)
Platelets: 134 10*3/uL — ABNORMAL LOW (ref 150–400)
RBC: 4.15 MIL/uL — ABNORMAL LOW (ref 4.22–5.81)
RDW: 15 % (ref 11.5–15.5)
WBC: 7.7 10*3/uL (ref 4.0–10.5)

## 2016-09-22 SURGERY — RADIOLOGY WITH ANESTHESIA
Anesthesia: General

## 2016-09-22 MED ORDER — PROMETHAZINE HCL 25 MG/ML IJ SOLN: 6.2500 mg | INTRAMUSCULAR | Status: DC | PRN

## 2016-09-22 MED ORDER — GADOBENATE DIMEGLUMINE 529 MG/ML IV SOLN
15.0000 mL | Freq: Once | INTRAVENOUS | Status: AC
  Administered 2016-09-22: 15 mL via INTRAVENOUS

## 2016-09-22 MED ORDER — LACTATED RINGERS IV SOLN: INTRAVENOUS | Status: DC

## 2016-09-22 MED ORDER — MEPERIDINE HCL 25 MG/ML IJ SOLN: 6.2500 mg | INTRAMUSCULAR | Status: DC | PRN

## 2016-09-22 MED ORDER — FENTANYL CITRATE (PF) 100 MCG/2ML IJ SOLN
25.0000 ug | INTRAMUSCULAR | Status: DC | PRN
Start: 2016-09-22 — End: 2016-09-29
  Administered 2016-09-29: 50 ug via INTRAVENOUS

## 2016-09-22 NOTE — Progress Notes (Signed)
PROGRESS NOTE                                                                                                                                                                                                             Patient Demographics:    Zachary Mccarthy, is a 76 y.o. male, DOB - 04-18-1941, XKG:[id number]  Admit date - 09/15/2016   Admitting Physician [name], MD  Outpatient Primary MD for the patient is [name], MD  LOS - 7  Outpatient Specialists:  No chief complaint on file.      Brief Narrative   76 year old male wit history of hypertension, hyperlipidemia, chronic kidney disease stage III, mild dementia, known invasive bladder cancer status post TURBT (currently on surveillance) admitted to neurosurgery service for severe right-sided back pain radiating to the right thigh and knee, progressive for the past 5 months severely limiting his mobility. (He is now wheelchair bound). MRI of the spine showed pathologic L4 compression fracture. On 5/1 ,Underwent right L4-5 laminectomy and foraminotomy with decompression, had L4 vertebral body biopsy. CT of the chest shows large right perihilar and infrahilar mass with mediastinal adenopathy and small right pleural effusion with postobstructive right lobe consolidation. Also has calcified pulmonary nodules on the right. Hypodense lesions in the liver also found. CT of the head done given findings of pulmonary nodules and some confusion showed right posterior parietal lobe hemorrhagic metastases with some mass effect.  Hospitalist consulting for acute confusion and other medical issues.   Subjective:   No overnight events. Slightly better oriented today.  Assessment  & Plan :   Pathological compression fracture of lumbar spine  secondary to metastasis from lung primary. Symptoms progressive over past 5 months associated with about 20 pound weight loss. -No  pain issues.  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 -CT chest and head findings as above. -  poorly differential adenocarcinoma on vertebral biopsy. 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 -Bone scan shows no other osseous metastases. -Continue Decadron for cerebral edema. -Oncology consult appreciated. SRS CT simulation done. -Primary team will schedule surgical resection of the cerebellar mass along with radiation therapy. -MRI of the brain with anesthesia today.    Acute encephalopathy Patient pleasantly confused possibly due to brain  metastases.   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 - S/p TURBT with negative cystoscopy on 06/03/2016 and has not had recurrence since 2015. - Followed by outpatient urology ([name].)    Essential hypertension Stable. Continue Norvasc and Coreg.    Hyperlipidemia Continue Zocor and Zetia.    Chronic kidney disease stage III Creatinine improved. (Baseline 1.5-1.9)       Code Status : Full code  Family Communication  : Wife at bedside  Disposition Plan  : Per primary team and further hospital course.    Procedures  :   right L4-5 laminectomy and foraminotomy with decompression,   L4 vertebral body biopsy. MRI brain Bone scan    DVT Prophylaxis  :  Lovenox -  Lab Results  Component Value Date   PLT 134 (L) 09/22/2016    Antibiotics  :    Anti-infectives    Start     Dose/Rate Route Frequency Ordered Stop   09/17/16 0000  ceFAZolin (ANCEF) IVPB 2g/100 mL premix     2 g 200 mL/hr over 30 Minutes Intravenous Every 8 hours 09/16/16 2015 09/17/16 0827   09/16/16 1700  bacitracin 50,000 Units in sodium chloride irrigation 0.9 % 500 mL irrigation  Status:  Discontinued       As needed 09/16/16 1700 09/16/16 1835   09/16/16 1547  ceFAZolin (ANCEF) 2-4 GM/100ML-%  IVPB    Comments:  Gallman, Kathie   : cabinet override      09/16/16 1547 09/17/16 0359        Objective:   Vitals:   09/21/16 1700 09/21/16 2136 09/22/16 0031 09/22/16 0516  BP: 131/84 (!) 148/67 (!) 149/61 130/63  Pulse: 75 69 71 73  Resp: '20 16 16 16  '$ Temp: 97.9 F (36.6 C) 97.6 F (36.4 C) 98.5 F (36.9 C) 99.4 F (37.4 C)  TempSrc: Oral Oral Axillary Oral  SpO2: 100% 96% 96% 94%  Weight:      Height:        Wt Readings from Last 3 Encounters:  09/16/16 66.8 kg (147 lb 4.8 oz)    No intake or output data in the 24 hours ending 09/22/16 1010   Physical Exam  Gen:Not in distress, less confused today. HEENT:  moist mucosa, supple neck, Chest: Clear to auscultation CVS: N S1&S2, no murmurs,  GI: soft, NT, ND,  Musculoskeletal:Warm, no edema  UXL:[id number] , no focal deficit    Data Review:    CBC  Recent Labs Lab 09/15/16 1507 09/16/16 0509 09/18/16 0242 09/22/16 0503  WBC 4.0 4.2 10.1 7.7  HGB 11.8* 12.1* 10.4* 11.0*  HCT 36.4* 37.7* 31.7* 33.8*  PLT 134* 147* 137* 134*  MCV 82.7 82.0 81.9 81.4  MCH 26.8 26.3 26.9 26.5  MCHC 32.4 32.1 32.8 32.5  RDW 15.1 15.0 15.1 15.0  LYMPHSABS  --   --  0.7  --   MONOABS  --   --  0.3  --   EOSABS  --   --  0.0  --   BASOSABS  --   --  0.0  --     Chemistries   Recent Labs Lab 09/15/16 1507 09/16/16 0509 09/18/16 0242 09/19/16 0458 09/22/16 0503  NA 137 137 133* 136 132*  K 4.0 3.6 4.2 4.4 4.1  CL 105 105 103 106 102  CO2 24 24 20* 21* 22  GLUCOSE 114* 97 152* 129* 181*  BUN 17 17 31* 32* 41*  CREATININE 1.57* 1.44*  1.64* 1.41* 1.45*  CALCIUM 9.2 9.1 8.3* 8.8* 8.1*  AST '26 26 25  '$ --   --   ALT 16* 15* 8*  --   --   ALKPHOS 35* 38 32*  --   --   BILITOT 0.6 0.8 0.5  --   --    ------------------------------------------------------------------------------------------------------------------ No results for input(s): CHOL, HDL, LDLCALC, TRIG, CHOLHDL, LDLDIRECT in the last 72 hours.  Lab  Results  Component Value Date   HGBA1C 6.5 (H) 09/15/2016   ------------------------------------------------------------------------------------------------------------------ No results for input(s): TSH, T4TOTAL, T3FREE, THYROIDAB in the last 72 hours.  Invalid input(s): FREET3 ------------------------------------------------------------------------------------------------------------------ No results for input(s): VITAMINB12, FOLATE, FERRITIN, TIBC, IRON, RETICCTPCT in the last 72 hours.  Coagulation profile  Recent Labs Lab 09/16/16 0509  INR 1.10    No results for input(s): DDIMER in the last 72 hours.  Cardiac Enzymes No results for input(s): CKMB, TROPONINI, MYOGLOBIN in the last 168 hours.  Invalid input(s): CK ------------------------------------------------------------------------------------------------------------------ No results found for: BNP  Inpatient Medications  Scheduled Meds: . amLODipine  10 mg Oral Daily  . carvedilol  25 mg Oral BID WC  . dexamethasone  4 mg Intravenous Q6H  . docusate sodium  100 mg Oral BID  . ezetimibe  10 mg Oral Daily  . feeding supplement (ENSURE ENLIVE)  237 mL Oral TID BM  . gabapentin  300 mg Oral TID  . pantoprazole  40 mg Oral QHS  . senna  1 tablet Oral BID  . simvastatin  20 mg Oral q1800  . sodium chloride flush  3 mL Intravenous Q12H   Continuous Infusions: . sodium chloride 75 mL/hr at 09/18/16 0136  . sodium chloride    . lactated ringers 50 mL/hr at 09/15/16 1813  . methocarbamol (ROBAXIN)  IV     PRN Meds:.bisacodyl, hydrALAZINE, HYDROcodone-acetaminophen, menthol-cetylpyridinium **OR** phenol, methocarbamol **OR** methocarbamol (ROBAXIN)  IV, morphine injection, ondansetron **OR** ondansetron (ZOFRAN) IV, polyethylene glycol, sodium chloride flush, sodium phosphate  Micro Results Recent Results (from the past 240 hour(s))  Surgical PCR screen     Status: None   Collection Time: 09/16/16  6:14 AM  Result  Value Ref Range Status   MRSA, PCR NEGATIVE NEGATIVE Final   Staphylococcus aureus NEGATIVE NEGATIVE Final    Comment:        The Xpert SA Assay (FDA approved for NASAL specimens in patients over 21 years of age), is one component of a comprehensive surveillance program.  Test performance has been validated by [facility] for patients greater than or equal to 49 year old. It is not intended to diagnose infection nor to guide or monitor treatment.     Radiology Reports Dg Chest 2 View  Result Date: 09/15/2016 CLINICAL DATA:  Preoperative respiratory evaluation. EXAM: CHEST  2 VIEW COMPARISON:  None. FINDINGS: The cardio pericardial silhouette is enlarged. Scattered nodules right lung may represent granulomata in there appears to be calcified lymph nodes in the right hilum. There is right base collapse/consolidation with small right pleural effusion. The visualized bony structures of the thorax are intact. IMPRESSION: 1. Multiple right pulmonary nodules, likely granulomata, but CT chest without contrast recommended to confirm. 2. Right base collapse/consolidation with small right pleural effusion. 3. Enlargement of the cardiopericardial silhouette, likely cardiomegaly although pericardial effusion not excluded. Electronically Signed   By: Misty Stanley M.D.   On: 09/15/2016 20:32   Dg Lumbar Spine 2-3 Views  Result Date: 09/16/2016 CLINICAL DATA:  L4-5 laminectomy/foraminectomy. EXAM: LUMBAR SPINE - 2-3 VIEW;  DG C-ARM 61-120 MIN COMPARISON:  MRI lumbar spine 08/28/2016 FINDINGS: Two spot fluoroscopic images of the lumbar spine demonstrate a surgical instrument over the posterior elements at the L4-5 level. Known L4 compression fracture. Mild spondylosis of the spine. Disc space narrowing at the L5-S1 level. Surgical clips are present anterior to the spine likely from previous vascular surgery. IMPRESSION: Mild spondylosis of the lumbar spine with disc disease at the L5-S1 level. Known L4  compression fracture with surgical instrument over the posterior elements at the L4-5 level. Electronically Signed   By: Marin Olp M.D.   On: 09/16/2016 18:54   Ct Head Wo Contrast  Result Date: 09/16/2016 CLINICAL DATA:  Pulmonary nodules, confusion EXAM: CT HEAD WITHOUT CONTRAST TECHNIQUE: Contiguous axial images were obtained from the base of the skull through the vertex without intravenous contrast. COMPARISON:  Same day chest CT FINDINGS: The study is compromised by motion artifacts. Brain: There is an ill-defined small right posterior parietal intra-axial hyperdensity seen, series 3, image 20 with an approximate 3.5 cm hypodense masslike abnormality in the right cerebellar hemisphere causing positive mass effect on the fourth ventricle. Findings are concerning for metastatic disease and edema given recent findings within the chest CT. More confluent areas of hypodensity are seen in the left frontal white matter likely representing transependymal spread of CSF. No extra-axial fluid collections are identified. Postobstructive dilatation on the lateral and third ventricles from mass effect on the fourth ventricle is noted. Vascular: No hyperdense vessel or unexpected calcification. Skull: Negative for focal lesions.  No fracture. Sinuses/Orbits: Limited by motion artifacts.  No acute finding. Other: None IMPRESSION: Intra-axial ill-defined hyperdensity in the posterior right parietal lobe may represent a small hemorrhagic metastasis. An additional 3.5 cm hypodense mass with adjacent edema in the right cerebellar hemisphere with positive mass effect on the fourth ventricle is also noted. These in conjunction CT chest findings are concerning for metastatic disease. MRI may help for further correlation. Postobstructive dilatation of the lateral third ventricles secondary to mass-effect of the fourth ventricle with transependymal spread of CSF in the left frontal lobe. Critical Value/emergent results were  called by telephone at the time of interpretation on 09/16/2016 at 11:29 pm to Nurse Elita Boone, who verbally acknowledged these results and will be contacting the oncall Neurosurgeon immediately. Electronically Signed   By: Ashley Royalty M.D.   On: 09/16/2016 23:29   Ct Chest Wo Contrast  Result Date: 09/16/2016 CLINICAL DATA:  Confusion, pulmonary nodules EXAM: CT CHEST WITHOUT CONTRAST TECHNIQUE: Multidetector CT imaging of the chest was performed following the standard protocol without IV contrast. COMPARISON:  None. FINDINGS: Cardiovascular: Cardiomegaly with small pericardial effusion and coronary arteriosclerosis. There is aortic atherosclerosis with aneurysmal dilatation of the ascending aorta up to 4.2 cm. There is dilatation of the main pulmonary artery to 3.9 cm consistent with a component of pulmonary hypertension. Atherosclerosis in the proximal great vessels is identified. Mediastinum/Nodes: Necrotic appearing cervical adenopathy is seen at the base of the neck on the left with central areas of hypodensity. Prevascular adenopathy measuring up to 1.5 cm short axis Left lower paratracheal 1.7 cm short axis lymphadenopathy is noted. The trachea is patent. The left mainstem bronchus is patent. There is narrowing of the right mainstem bronchus and bronchi to the right middle and lower lobes secondary to a mass about the right hilum described below. Lungs/Pleura: There is a large masslike abnormality in the infrahilar right hemithorax abutting the right heart border and measuring roughly 11 x 7.3 x  5.8 cm in AP by transverse by CC dimension. Exact margins are difficult to distinguish given lack of IV contrast and also from postobstructive changes to the right middle and lower lobes. Additionally there are calcified nodules in the right upper, middle and lower lobes consistent granulomas. Right upper lobe 4.1 x 3.1 mm noncalcified nodule, series 5, image 31, oblong 8.1 x 2.9 mm nodule series 5, image 35,  posterior segment right upper lobe 5.3 mm nodule series 5, image 70, superior segment right lower lobe series 5, image 83 4.6 mm nodules are also seen potentially representing metastatic nodules or noncalcified granulomas in light of the above findings. Small right pleural effusion is present. Upper Abdomen: Calcified granuloma in the right middle lobe. Indeterminate 12 mm hypodense lesion in the left hepatic lobe series 4, image 165 and the right hepatic dome mm hypodensity, series 4, image 129 are noted. Mild thickening of the adrenal glands. Aortic and branch vessel atherosclerosis is identified within the upper abdomen. There is mild fullness of both renal collecting systems. Musculoskeletal: No suspicious lytic or blastic disease. IMPRESSION: 1. Large right perihilar and infrahilar masslike abnormality measuring 11 x 7.3 x 5.8 cm with mediastinal adenopathy. Associated small right pleural effusion with postobstructive consolidations in the right lower lobe. Findings are concerning for bronchogenic carcinoma with metastatic adenopathy. There appears be cervical necrotic adenopathy at the base of the neck on the left for which CT or MRI with IV contrast is also recommended for better assessment. 2. Calcified and noncalcified subcentimeter pulmonary nodules are noted predominantly within the right lung. A calcified nodules may simply reflect granulomas however the noncalcified nodules cannot exclude metastatic pulmonary nodules. 3. Indeterminate hypodense lesions in the liver measuring 7 mm on the right and 12 mm on the left. 4. Aortic atherosclerosis.  Coronary arteriosclerosis. 5. 4.2 cm ascending aortic aneurysm. Recommend annual imaging followup by CTA or MRA. This recommendation follows 2010 ACCF/AHA/AATS/ACR/ASA/SCA/SCAI/SIR/STS/SVM Guidelines for the Diagnosis and Management of Patients with Thoracic Aortic Disease. Circulation. 2010; 121: [id number] Electronically Signed   By: Ashley Royalty M.D.   On:  09/16/2016 23:11   Mr Jeri Cos HY Contrast  Addendum Date: 09/19/2016   ADDENDUM REPORT: 09/19/2016 21:59 ADDENDUM: Coronal and axial reformations at the sagittal CUBE now submitted. Electronically Signed   By: Elon Alas M.D.   On: 09/19/2016 21:59   Result Date: 09/19/2016 CLINICAL DATA:  Multiple brain metastasis, here for pre-surgical planning. EXAM: MRI HEAD WITHOUT AND WITH CONTRAST TECHNIQUE: Multiplanar, multiecho pulse sequences of the brain and surrounding structures were obtained without and with intravenous contrast. Coronal T2, sagittal and coronal T1 post gadolinium not obtained. CONTRAST:  51m MULTIHANCE GADOBENATE DIMEGLUMINE 529 MG/ML IV SOLN COMPARISON:  CT HEAD Sep 16, 2016 FINDINGS: Multiple sequences are moderately or severely motion degraded. Per technologist note, patient was confused and had difficulty remaining still and following directions. Maximum medications given and multiple sequences re-attempted. Severely motion degraded post gadolinium axial T1 sequences. INTRACRANIAL CONTENTS: Due to motion and lack of contrast, multiple intracranial metastasis or difficult to characterize in measurements are approximations. Multiple complex heterogeneous metastasis with susceptibility artifact consistent with blood products in T2 bright surrounding vasogenic edema as follows: 4.8 x 4.9 cm RIGHT cerebellum, 13 x 14 mm LEFT cerebellum, 5 mm LEFT cerebellum 11 x 9 mm RIGHT temporal lobe, 19 x 17 mm RIGHT occipital lobe, 13 x 18 mm LEFT inferior frontal lobe. RIGHT cerebellar mass nearly effaces the fourth ventricle, narrowing the cerebral aqua duct in resulting  in moderate to severe obstructive hydrocephalus with a component of interstitial edema. Mild upwards and inferior cerebellar herniation. Mildly effaced pre pontine cistern. No supratentorial midline shift. A few additional scattered micro hemorrhages could represent metastasis or sequelae of chronic hypertension. Mild additional  presumed chronic small vessel ischemic disease. No abnormal extra-axial fluid collections. VASCULAR: Normal major intracranial vascular flow voids present at skull base. SKULL AND UPPER CERVICAL SPINE: No abnormal sellar expansion. No suspicious calvarial bone marrow signal. Craniocervical junction maintained. Severe degenerative change of the included cervical spine SINUSES/ORBITS: Small LEFT maxillary mucosal retention cyst without paranasal sinus air-fluid levels. Mastoid air cells are well aerated. The included ocular globes and orbital contents are non-suspicious. Status post bilateral ocular lens implants. OTHER: Patient is edentulous. No abnormal extra-axial fluid collections. IMPRESSION: Limited, incomplete MRI of the brain, nondiagnostic post gadolinium axial T1 sequences, limiting detection of metastatic disease. At least 3 infratentorial and 3 supratentorial hemorrhagic metastasis (additional metastasis may be present). Dominant 4.8 x 4.9 cm RIGHT cerebellar metastasis results in moderate to severe obstructive hydrocephalus. These results will be called to the ordering clinician or representative by the Radiologist Assistant, and communication documented in the [employer] Dashboard. Electronically Signed: By: Elon Alas M.D. On: 09/18/2016 22:58   Nm Bone Scan Whole Body  Result Date: 09/18/2016 CLINICAL DATA:  Bronchogenic carcinoma.  L4 compression fracture. EXAM: NUCLEAR MEDICINE WHOLE BODY BONE SCAN TECHNIQUE: Whole body anterior and posterior images were obtained approximately 3 hours after intravenous injection of radiopharmaceutical. RADIOPHARMACEUTICALS:  25 MCi Technetium-50mMDP IV COMPARISON:  Lumbar spine MRI 08/28/2016 FINDINGS: Bladder and bilateral renal activity is present. There is a band of increased signal at the pathologic L4 body fracture. No abnormal signal elsewhere. IMPRESSION: Activity at the known L4 pathologic body fracture. No indication of osseous metastasis elsewhere.  Electronically Signed   By: JMonte FantasiaM.D.   On: 09/18/2016 15:17   Dg C-arm 1-60 Min  Result Date: 09/16/2016 CLINICAL DATA:  L4-5 laminectomy/foraminectomy. EXAM: LUMBAR SPINE - 2-3 VIEW; DG C-ARM 61-120 MIN COMPARISON:  MRI lumbar spine 08/28/2016 FINDINGS: Two spot fluoroscopic images of the lumbar spine demonstrate a surgical instrument over the posterior elements at the L4-5 level. Known L4 compression fracture. Mild spondylosis of the spine. Disc space narrowing at the L5-S1 level. Surgical clips are present anterior to the spine likely from previous vascular surgery. IMPRESSION: Mild spondylosis of the lumbar spine with disc disease at the L5-S1 level. Known L4 compression fracture with surgical instrument over the posterior elements at the L4-5 level. Electronically Signed   By: DMarin OlpM.D.   On: 09/16/2016 18:54    Time Spent in minutes  25   DLouellen MolderM.D on 09/22/2016 at 10:10 AM  Between 7am to 7pm - Pager - [phone] After 7pm go to [email or web address] - password [email or web address] -  Office  [phone]

## 2016-09-22 NOTE — Care Management Note (Signed)
Case Management Note  Patient Details  Name: Kelechi Orgeron MRN: [id number] Date of Birth: December 14, 1940  Subjective/Objective:                    Action/Plan: Plan is for patient to have [facility] and then Craniotomy and then SNF when medically ready. CM following.  Expected Discharge Date:                  Expected Discharge Plan:     In-House Referral:     Discharge planning Services     Post Acute Care Choice:    Choice offered to:     DME Arranged:    DME Agency:     HH Arranged:    HH Agency:     Status of Service:  In process, will continue to follow  If discussed at Long Length of Stay Meetings, dates discussed:    Additional Comments:  Pollie Friar, RN 09/22/2016, 3:29 PM

## 2016-09-22 NOTE — Anesthesia Postprocedure Evaluation (Addendum)
Anesthesia Post Note  Patient: Zachary Mccarthy  Procedure(s) Performed: Procedure(s) (LRB): RADIOLOGY WITH ANESTHESIA (N/A)  Patient location during evaluation: PACU Anesthesia Type: General Level of consciousness: awake and alert Pain management: pain level controlled Vital Signs Assessment: post-procedure vital signs reviewed and stable Respiratory status: spontaneous breathing, nonlabored ventilation, respiratory function stable and patient connected to nasal cannula oxygen Cardiovascular status: blood pressure returned to baseline and stable Postop Assessment: no signs of nausea or vomiting Anesthetic complications: no       Last Vitals:  Vitals:   09/22/16 1400 09/22/16 1415  BP: (!) 154/79 (!) 150/73  Pulse: 64 64  Resp: 11 11  Temp:  36.3 C    Last Pain:  Vitals:   09/22/16 1053  TempSrc: Oral  PainSc:                  Effie Berkshire

## 2016-09-22 NOTE — Anesthesia Preprocedure Evaluation (Addendum)
Anesthesia Evaluation  Patient identified by MRN, date of birth, ID band Patient awake    Reviewed: Allergy & Precautions, Patient's Chart, lab work & pertinent test results  Airway Mallampati: II  TM Distance: >3 FB Neck ROM: Full    Dental  (+) Teeth Intact, Dental Advisory Given   Pulmonary former smoker,    breath sounds clear to auscultation       Cardiovascular hypertension, Pt. on medications and Pt. on home beta blockers  Rhythm:Regular Rate:Normal     Neuro/Psych PSYCHIATRIC DISORDERS negative neurological ROS     GI/Hepatic negative GI ROS, Neg liver ROS,   Endo/Other  negative endocrine ROS  Renal/GU Renal disease  negative genitourinary   Musculoskeletal negative musculoskeletal ROS (+)   Abdominal   Peds negative pediatric ROS (+)  Hematology negative hematology ROS (+)   Anesthesia Other Findings   Reproductive/Obstetrics negative OB ROS                           Lab Results  Component Value Date   WBC 7.7 09/22/2016   HGB 11.0 (L) 09/22/2016   HCT 33.8 (L) 09/22/2016   MCV 81.4 09/22/2016   PLT 134 (L) 09/22/2016   Lab Results  Component Value Date   CREATININE 1.45 (H) 09/22/2016   BUN 41 (H) 09/22/2016   NA 132 (L) 09/22/2016   K 4.1 09/22/2016   CL 102 09/22/2016   CO2 22 09/22/2016   Lab Results  Component Value Date   INR 1.10 09/16/2016   EKG: normal sinus rhythm, occasional PVC noted, unifocal.  Anesthesia Physical Anesthesia Plan  ASA: II  Anesthesia Plan: General   Post-op Pain Management:    Induction: Intravenous  Airway Management Planned: LMA  Additional Equipment:   Intra-op Plan:   Post-operative Plan: Extubation in OR  Informed Consent: I have reviewed the patients History and Physical, chart, labs and discussed the procedure including the risks, benefits and alternatives for the proposed anesthesia with the patient or  authorized representative who has indicated his/her understanding and acceptance.   Dental advisory given  Plan Discussed with: CRNA  Anesthesia Plan Comments:         Anesthesia Quick Evaluation

## 2016-09-22 NOTE — Progress Notes (Signed)
No issues overnight. Appears well, no complaints.  EXAM:  BP 130/63 (BP Location: Right Arm)   Pulse 73   Temp 99.4 F (37.4 C) (Oral)   Resp 16   Ht 5' 9"  (1.753 m)   Wt 66.8 kg (147 lb 4.8 oz)   SpO2 94%   BMI 21.75 kg/m   Awake, alert Speech fluent CN grossly intact  5/5 BUE/BLE  Wound c/d/i  IMPRESSION:  76 y.o. male with metastatic NSC lung CA, large cerebellar met with hydrocephalus will need resection  PLAN: - MRI with anesthesia today - Preop SRS at [facility] later this week, plan on resection after that date/time to be scheduled. - Cont steroids

## 2016-09-22 NOTE — Progress Notes (Signed)
Physical Therapy Treatment Patient Details Name: Zachary Mccarthy MRN: [id number] DOB: 09-11-1940 Today's Date: 09/22/2016    History of Present Illness Pt is a 76 y/o male s/p right L4-5 laminotomy/foraminotomy and L4 vertebral body biopsy. Pt has a past medical history of  Hyperlipidemia; Hypertension; Cataract extraction; and Vascular surgery. 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 Chest CT showed large mass with adenopathy and small pleural effusion with postobstructive right lobe consolidation. Also has calcified pulmonary nodules on the right. Hypodense lesions in the liver also found.    PT Comments    Pt agreeable to ambulating in the hall this session.  He was impulsive and required min-mod assist for walker management and to recover from LOB at times.  He was educated on back precautions but was unable to demonstrate understanding.  He is progressing toward PT goals and will benefit from continued skilled services at the SNF level to maximize his functional independence to decrease burden of care for wife.  Will follow acutely.   Follow Up Recommendations  SNF;Supervision/Assistance - 24 hour     Equipment Recommendations  None recommended by PT (TBD)    Recommendations for Other Services       Precautions / Restrictions Precautions Precautions: Fall;Back Precaution Booklet Issued: No Precaution Comments: reviewed back precautions with pt Restrictions Weight Bearing Restrictions: No    Mobility  Bed Mobility               General bed mobility comments: Not assessed this session, pt OOB in recliner initially  Transfers Overall transfer level: Needs assistance Equipment used: None;Rolling walker (2 wheeled) Transfers: Sit to/from Stand Sit to Stand: Min guard          General transfer comment: Initially pt stood up without RW for trip to bathroom. Min guard for safety.  VC's required for hand placement on armrests.  Ambulation/Gait Ambulation/Gait assistance: Min assist;Mod assist Ambulation Distance (Feet): 400 Feet Assistive device: None;Rolling walker (2 wheeled) Gait Pattern/deviations: Step-through pattern;Trunk flexed;Narrow base of support Gait velocity: normal Gait velocity interpretation: at or above normal speed for age/gender General Gait Details: Pt ambulated to bathroom without RW and required mod A to recover from LOB x2 and min A to control posterior lean while standing at commode.  Pt given RW for rest of ambulation requiring min A for walker management during change of direction and to maneuver around objects in room.  Pt impulsive at times, bending down to indicate area of fatigue in leg and pretending walker is a race car.  VC's required for upright posture and walker proximity.   Stairs            Wheelchair Mobility    Modified Rankin (Stroke Patients Only)       Balance Overall balance assessment: Needs assistance Sitting-balance support: Bilateral upper extremity supported Sitting balance-Leahy Scale: Fair   Postural control: Posterior lean Standing balance support: No upper extremity supported;Bilateral upper extremity supported Standing balance-Leahy Scale: Poor Standing balance comment: Min A required to maintain standing balance without RW.                            Cognition Arousal/Alertness: Awake/alert Behavior During Therapy: Impulsive Overall Cognitive Status: Impaired/Different from baseline Area of Impairment: Attention;Following commands;Safety/judgement;Problem solving  Current Attention Level: Selective Memory: Decreased recall of precautions;Decreased short-term memory Following Commands: Follows one step commands inconsistently Safety/Judgement: Decreased  awareness of safety;Decreased awareness of deficits   Problem Solving: Slow processing;Difficulty sequencing;Requires verbal cues;Requires tactile cues General Comments: Pt requiring heavy cueing for walker management      Exercises      General Comments General comments (skin integrity, edema, etc.): Pt wife present this session.  Pt and wife educated to use walker for trips to [facility] with staff member.      Pertinent Vitals/Pain Pain Assessment: No/denies pain Faces Pain Scale: Hurts a little bit Pain Location: legs Pain Descriptors / Indicators: Tiring Pain Intervention(s): Monitored during session    Home Living                      Prior Function            PT Goals (current goals can now be found in the care plan section) Acute Rehab PT Goals Patient Stated Goal: not expressed PT Goal Formulation: With patient/family Time For Goal Achievement: 10/01/16 Potential to Achieve Goals: Good Progress towards PT goals: Progressing toward goals    Frequency    Min 5X/week      PT Plan Current plan remains appropriate    Co-evaluation              AM-PAC PT "6 Clicks" Daily Activity  Outcome Measure  Difficulty turning over in bed (including adjusting bedclothes, sheets and blankets)?: A Little Difficulty moving from lying on back to sitting on the side of the bed? : A Lot Difficulty sitting down on and standing up from a chair with arms (e.g., wheelchair, bedside commode, etc,.)?: Total Help needed moving to and from a bed to chair (including a wheelchair)?: A Little Help needed walking in hospital room?: A Little Help needed climbing 3-5 steps with a railing? : A Lot 6 Click Score: 14    End of Session Equipment Utilized During Treatment: Gait belt Activity Tolerance: Patient tolerated treatment well Patient left: in chair;with call bell/phone within reach;with chair alarm set;with family/visitor present Nurse Communication: Mobility status PT  Visit Diagnosis: Unsteadiness on feet (R26.81);Muscle weakness (generalized) (M62.81);Difficulty in walking, not elsewhere classified (R26.2)     Time: 4656-8127 PT Time Calculation (min) (ACUTE ONLY): 22 min  Charges:                       G Codes:       Gaetano Net SPT  Gaetano Net 09/22/2016, 1:12 PM

## 2016-09-22 NOTE — Transfer of Care (Signed)
Immediate Anesthesia Transfer of Care Note  Patient: Zachary Mccarthy  Procedure(s) Performed: Procedure(s): RADIOLOGY WITH ANESTHESIA (N/A)  Patient Location: PACU  Anesthesia Type:General  Level of Consciousness: awake, alert , oriented and patient cooperative  Airway & Oxygen Therapy: Patient Spontanous Breathing  Post-op Assessment: Report given to RN, Post -op Vital signs reviewed and stable and Patient moving all extremities  Post vital signs: Reviewed and stable  Last Vitals:  Vitals:   09/22/16 0516 09/22/16 1053  BP: 130/63 128/64  Pulse: 73 65  Resp: 16 18  Temp: 37.4 C 37.1 C    Last Pain:  Vitals:   09/22/16 1053  TempSrc: Oral  PainSc:          Complications: No apparent anesthesia complications

## 2016-09-23 ENCOUNTER — Telehealth: Payer: Self-pay | Admitting: Radiation Therapy

## 2016-09-23 ENCOUNTER — Encounter (HOSPITAL_COMMUNITY): Payer: Self-pay | Admitting: Radiology

## 2016-09-23 DIAGNOSIS — Z515 Encounter for palliative care: Secondary | ICD-10-CM

## 2016-09-23 DIAGNOSIS — C3490 Malignant neoplasm of unspecified part of unspecified bronchus or lung: Secondary | ICD-10-CM

## 2016-09-23 DIAGNOSIS — R634 Abnormal weight loss: Secondary | ICD-10-CM

## 2016-09-23 DIAGNOSIS — Z7189 Other specified counseling: Secondary | ICD-10-CM

## 2016-09-23 DIAGNOSIS — C7951 Secondary malignant neoplasm of bone: Secondary | ICD-10-CM

## 2016-09-23 NOTE — Care Management Important Message (Signed)
Important Message  Patient Details  Name: Zachary Mccarthy MRN: [id number] Date of Birth: 1940-06-01   Medicare Important Message Given:  Yes    Anwar Crill Montine Circle 09/23/2016, 11:23 AM

## 2016-09-23 NOTE — Progress Notes (Signed)
Physical Therapy Treatment Patient Details Name: Zachary Mccarthy MRN: [id number] DOB: [birth year] Today's Date: 09/23/2016    History of Present Illness Pt is a 76 y/o male s/p right L4-5 laminotomy/foraminotomy and L4 vertebral body biopsy. Pt has a past medical history of  Hyperlipidemia; Hypertension; Cataract extraction; and Vascular surgery. Pt does have large right cerebellar tumor with resultant hydrocephalus and therefore will require surgery for relief of mass effect (now planned for Monday 5/14). 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 Chest CT showed large mass with adenopathy and small pleural effusion with postobstructive right lobe consolidation. Also has calcified pulmonary nodules on the right. Hypodense lesions in the liver also found.    PT Comments    Pt progressing towards physical therapy goals. Continues to have difficulty recalling back precautions and maintaining precautions during functional mobility. Wife present throughout session for education, however was not very engaged in session. She appears overwhelmed with information and reports that she "just needs to get him through surgery on Monday". Pt also tearful throughout session when talking about his family and upcoming surgery. PT provided emotional support to pt and wife.   Follow Up Recommendations  SNF;Supervision/Assistance - 24 hour     Equipment Recommendations  None recommended by PT (TBD)    Recommendations for Other Services       Precautions / Restrictions Precautions Precautions: Fall;Back Precaution Booklet Issued: No Precaution Comments: Pt was cued for precautions during functional mobility.  Restrictions Weight Bearing Restrictions: No    Mobility  Bed Mobility               General bed mobility comments: Pt was sitting up in recliner upon PT arrival.   Transfers Overall transfer level: Needs assistance Equipment  used: Rolling walker (2 wheeled) Transfers: Sit to/from Stand Sit to Stand: Min guard         General transfer comment: Min guard assist for stability with VC's for safety and back precautions.  Ambulation/Gait Ambulation/Gait assistance: Min assist Ambulation Distance (Feet): 400 Feet Assistive device: None;Rolling walker (2 wheeled) Gait Pattern/deviations: Step-through pattern;Trunk flexed;Narrow base of support Gait velocity: normal Gait velocity interpretation: at or above normal speed for age/gender General Gait Details: VC's for improved posture and safety with RW use. Pt took several standing rest breaks due to fatigue as pt had just worked with OT, but was motivated for distance. Assist provided to increase trunk extension to neutral, as well as to prevent walker from getting too far away from patient.    Stairs            Wheelchair Mobility    Modified Rankin (Stroke Patients Only)       Balance Overall balance assessment: Needs assistance Sitting-balance support: Bilateral upper extremity supported Sitting balance-Leahy Scale: Good   Postural control: Posterior lean Standing balance support: No upper extremity supported;Bilateral upper extremity supported Standing balance-Leahy Scale: Poor Standing balance comment: Physical assist or UE support required during standing ADL.                            Cognition Arousal/Alertness: Awake/alert Behavior During Therapy: Impulsive Overall Cognitive Status: Impaired/Different from baseline Area of Impairment: Attention;Orientation;Memory;Following commands;Safety/judgement;Problem solving;Awareness                 Orientation Level: Disoriented to;Place;Situation Current Attention Level: Selective Memory: Decreased recall of precautions;Decreased short-term memory Following Commands: Follows one step commands inconsistently Safety/Judgement: Decreased awareness  of safety;Decreased awareness  of deficits Awareness: Emergent Problem Solving: Slow processing;Difficulty sequencing;Requires verbal cues;Requires tactile cues General Comments: Cueing for RW use. Pt with little understanding of deficits. Decreased short-term memory and poor attention impacting ability to follow commands.      Exercises      General Comments        Pertinent Vitals/Pain Pain Assessment: Faces Faces Pain Scale: Hurts a little bit Pain Location: legs Pain Descriptors / Indicators: Tiring Pain Intervention(s): Limited activity within patient's tolerance;Monitored during session;Repositioned    Home Living Family/patient expects to be discharged to:: Private residence Living Arrangements: Spouse/significant other Available Help at Discharge: Family;Available 24 hours/day Type of Home: House Home Access: Stairs to enter Entrance Stairs-Rails: None Home Layout: One level Home Equipment: Cane - single point      Prior Function Level of Independence: Independent      Comments: driving, no SPC until about a month ago    PT Goals (current goals can now be found in the care plan section) Acute Rehab PT Goals Patient Stated Goal: Home at d/c PT Goal Formulation: With patient/family Time For Goal Achievement: 10/01/16 Potential to Achieve Goals: Good Progress towards PT goals: Progressing toward goals    Frequency    Min 5X/week      PT Plan Current plan remains appropriate    Co-evaluation              AM-PAC PT "6 Clicks" Daily Activity  Outcome Measure  Difficulty turning over in bed (including adjusting bedclothes, sheets and blankets)?: A Little Difficulty moving from lying on back to sitting on the side of the bed? : A Lot Difficulty sitting down on and standing up from a chair with arms (e.g., wheelchair, bedside commode, etc,.)?: Total Help needed moving to and from a bed to chair (including a wheelchair)?: A Little Help needed walking in hospital room?: A  Little Help needed climbing 3-5 steps with a railing? : A Lot 6 Click Score: 14    End of Session Equipment Utilized During Treatment: Gait belt Activity Tolerance: Patient tolerated treatment well Patient left: in chair;with call bell/phone within reach;with chair alarm set;with family/visitor present Nurse Communication: Mobility status PT Visit Diagnosis: Unsteadiness on feet (R26.81);Muscle weakness (generalized) (M62.81);Difficulty in walking, not elsewhere classified (R26.2)     Time: 6979-4801 PT Time Calculation (min) (ACUTE ONLY): 19 min  Charges:  $Gait Training: 8-22 mins                    G Codes:       Rolinda Roan, PT, DPT Acute Rehabilitation Services Pager: [place] 09/23/2016, 2:20 PM

## 2016-09-23 NOTE — Telephone Encounter (Signed)
I spoke with Zachary Mccarthy nurse, Mable Fill, and his wife to inform them of the [facility] Brain treatment scheduled for Friday 5/11 @ 8:00 and surgery Monday to remove the largest lesion. Marissa plans to have [facility] set up to bring the patient over on 5/11 at 7:30am  just in case they are running behind.  I also shared that the 5/7 MRI was much better quality than the previous scan. We were able to see 10 areas of enhancement requiring treatment and that the plans are to treat all 10 Friday and to have surgery on Monday, 5/14 to remove the largest lesion in his cerebellum.   [name] understood and is happy with this plan.   Mont Dutton R.T.(R)(T) Special Procedures Navigator

## 2016-09-23 NOTE — Progress Notes (Signed)
Responded to nurse's request in progression to see pt and wife in light of new diagnosis.Nurse and I had then discussed it might cheer pt up to walk around, even outside in the beautiful day or get out in a wheelchair if need be before his late afternoon test. Stopped by later when pt was out of rm. Gave palliative nurse head's-up re: pt. Pt's nurse said he had walked a great deal w/ PT before going to his test. Will stop by again soon.Marland Kitchen   09/22/16 1300  Clinical Encounter Type  Visited With Patient not available;Health care provider  Visit Type Initial;Psychological support;Spiritual support;Social support  Referral From Nurse   Gerrit Heck, Chaplain

## 2016-09-23 NOTE — Progress Notes (Signed)
Stopped by to visit w/ pt and wife, but he seemed to be dozing in chair by window, and wife was on phone. Told her would stop back by another time. Palliative nurse made first visit yesterday. Chaplain available for f/u.   09/23/16 1600  Clinical Encounter Type  Visited With Patient not available  Visit Type Follow-up  Referral From [place] Donovon Micheletti, Chaplain

## 2016-09-23 NOTE — Progress Notes (Signed)
PROGRESS NOTE                                                                                                                                                                                                             Patient Demographics:    Zachary Mccarthy, is a 76 y.o. male, DOB - 1940/11/29, YTK:[id number]  Admit date - 09/15/2016   Admitting Physician [name], MD  Outpatient Primary MD for the patient is [name], MD  LOS - 8  Outpatient Specialists:  No chief complaint on file.      Brief Narrative   76 year old male wit history of hypertension, hyperlipidemia, chronic kidney disease stage III, mild dementia, known invasive bladder cancer status post TURBT (currently on surveillance) admitted to neurosurgery service for severe right-sided back pain radiating to the right thigh and knee, progressive for the past 5 months severely limiting his mobility. (He is now wheelchair bound). MRI of the spine showed pathologic L4 compression fracture. On 5/1 ,Underwent right L4-5 laminectomy and foraminotomy with decompression, had L4 vertebral body biopsy. CT of the chest shows large right perihilar and infrahilar mass with mediastinal adenopathy and small right pleural effusion with postobstructive right lobe consolidation. Also has calcified pulmonary nodules on the right. Hypodense lesions in the liver also found. CT of the head done given findings of pulmonary nodules and some confusion showed right posterior parietal lobe hemorrhagic metastases with some mass effect.  Hospitalist consulting for acute confusion and other medical issues.   Subjective:   No overnight events. Slightly better oriented today.  Assessment  & Plan :   Pathological compression fracture of lumbar spine  secondary to metastasis from lung primary. Symptoms progressive over past 5 months associated with about 20 pound weight loss. -No  pain issues.  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 -CT chest and head findings as above. -  poorly differential adenocarcinoma on vertebral biopsy. 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 -Bone scan shows no other osseous metastases. -Continue Decadron for cerebral edema. -Oncology following. -Plan on SRS brain radiation on 5/11 and then surgical resection on 5/14.  Palliative care consulted for goals of care discussion.  Acute encephalopathy Patient pleasantly confused possibly due to brain metastases.   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 - S/p TURBT with negative cystoscopy on 06/03/2016 and has not had recurrence since 2015. - Followed by outpatient urology ([name].)    Essential hypertension Stable. Continue Norvasc and Coreg.    Hyperlipidemia Continue Zocor and Zetia.    Chronic kidney disease stage III Creatinine improved. (Baseline 1.5-1.9)       Code Status : Full code  Family Communication  : Wife at bedside  Disposition Plan  : Pending surgery.    Procedures  :   right L4-5 laminectomy and foraminotomy with decompression,   L4 vertebral body biopsy. MRI brain Bone scan  No acute medical issues to follow at present. Will sign off for now. We will be happy to consult back after surgery or before if any acute medical issues.    DVT Prophylaxis  :  Lovenox -  Lab Results  Component Value Date   PLT 134 (L) 09/22/2016    Antibiotics  :    Anti-infectives    Start     Dose/Rate Route Frequency Ordered Stop   09/17/16 0000  ceFAZolin (ANCEF) IVPB 2g/100 mL premix     2 g 200 mL/hr over 30 Minutes Intravenous Every 8 hours 09/16/16 2015 09/17/16 0827   09/16/16 1700  bacitracin 50,000 Units in sodium chloride irrigation 0.9 % 500 mL irrigation  Status:  Discontinued       As  needed 09/16/16 1700 09/16/16 1835   09/16/16 1547  ceFAZolin (ANCEF) 2-4 GM/100ML-% IVPB    Comments:  Gallman, Kathie   : cabinet override      09/16/16 1547 09/17/16 0359        Objective:   Vitals:   09/22/16 2135 09/23/16 0036 09/23/16 0500 09/23/16 0905  BP: 127/62 134/68 (!) 144/62 (!) 144/62  Pulse: 73 71 74 68  Resp: '18 18 18 16  '$ Temp: 98.7 F (37.1 C) 98.6 F (37 C) 98.2 F (36.8 C) 97.8 F (36.6 C)  TempSrc: Oral Oral Oral Oral  SpO2: 95% 96% 97% 92%  Weight:      Height:        Wt Readings from Last 3 Encounters:  09/16/16 66.8 kg (147 lb 4.8 oz)     Intake/Output Summary (Last 24 hours) at 09/23/16 1048 Last data filed at 09/23/16 0905  Gross per 24 hour  Intake              590 ml  Output                0 ml  Net              590 ml     Physical Exam  Gen:Not in distress, Still confused. HEENT:  moist mucosa, supple neck, Chest: Clear to auscultation CVS: N S1&S2,  GI: soft, NT, ND,  Musculoskeletal:Warm, no edema  YTK:[id number] , no focal deficit    Data Review:    CBC  Recent Labs Lab 09/18/16 0242 09/22/16 0503  WBC 10.1 7.7  HGB 10.4* 11.0*  HCT 31.7* 33.8*  PLT 137* 134*  MCV 81.9 81.4  MCH 26.9 26.5  MCHC 32.8 32.5  RDW 15.1 15.0  LYMPHSABS 0.7  --   MONOABS 0.3  --   EOSABS 0.0  --   BASOSABS 0.0  --     Chemistries   Recent Labs Lab 09/18/16 0242 09/19/16 0458 09/22/16 0503  NA 133* 136 132*  K 4.2 4.4 4.1  CL 103 106 102  CO2 20* 21* 22  GLUCOSE 152* 129* 181*  BUN 31* 32* 41*  CREATININE 1.64* 1.41* 1.45*  CALCIUM 8.3* 8.8* 8.1*  AST 25  --   --   ALT 8*  --   --   ALKPHOS 32*  --   --   BILITOT 0.5  --   --    ------------------------------------------------------------------------------------------------------------------ No results for input(s): CHOL, HDL, LDLCALC, TRIG, CHOLHDL, LDLDIRECT in the last 72 hours.  Lab Results  Component Value Date   HGBA1C 6.5 (H) 09/15/2016    ------------------------------------------------------------------------------------------------------------------ No results for input(s): TSH, T4TOTAL, T3FREE, THYROIDAB in the last 72 hours.  Invalid input(s): FREET3 ------------------------------------------------------------------------------------------------------------------ No results for input(s): VITAMINB12, FOLATE, FERRITIN, TIBC, IRON, RETICCTPCT in the last 72 hours.  Coagulation profile No results for input(s): INR, PROTIME in the last 168 hours.  No results for input(s): DDIMER in the last 72 hours.  Cardiac Enzymes No results for input(s): CKMB, TROPONINI, MYOGLOBIN in the last 168 hours.  Invalid input(s): CK ------------------------------------------------------------------------------------------------------------------ No results found for: BNP  Inpatient Medications  Scheduled Meds: . amLODipine  10 mg Oral Daily  . carvedilol  25 mg Oral BID WC  . dexamethasone  4 mg Intravenous Q6H  . docusate sodium  100 mg Oral BID  . ezetimibe  10 mg Oral Daily  . feeding supplement (ENSURE ENLIVE)  237 mL Oral TID BM  . gabapentin  300 mg Oral TID  . pantoprazole  40 mg Oral QHS  . senna  1 tablet Oral BID  . simvastatin  20 mg Oral q1800   Continuous Infusions: . lactated ringers 50 mL/hr at 09/15/16 1813  . lactated ringers Stopped (09/22/16 1400)  . methocarbamol (ROBAXIN)  IV     PRN Meds:.bisacodyl, fentaNYL (SUBLIMAZE) injection, hydrALAZINE, HYDROcodone-acetaminophen, menthol-cetylpyridinium **OR** phenol, meperidine (DEMEROL) injection, methocarbamol **OR** methocarbamol (ROBAXIN)  IV, morphine injection, ondansetron **OR** ondansetron (ZOFRAN) IV, polyethylene glycol, promethazine, sodium phosphate  Micro Results Recent Results (from the past 240 hour(s))  Surgical PCR screen     Status: None   Collection Time: 09/16/16  6:14 AM  Result Value Ref Range Status   MRSA, PCR NEGATIVE NEGATIVE Final    Staphylococcus aureus NEGATIVE NEGATIVE Final    Comment:        The Xpert SA Assay (FDA approved for NASAL specimens in patients over 38 years of age), is one component of a comprehensive surveillance program.  Test performance has been validated by [facility] for patients greater than or equal to 53 year old. It is not intended to diagnose infection nor to guide or monitor treatment.     Radiology Reports Dg Chest 2 View  Result Date: 09/15/2016 CLINICAL DATA:  Preoperative respiratory evaluation. EXAM: CHEST  2 VIEW COMPARISON:  None. FINDINGS: The cardio pericardial silhouette is enlarged. Scattered nodules right lung may represent granulomata in there appears to be calcified lymph nodes in the right hilum. There is right base collapse/consolidation with small right pleural effusion. The visualized bony structures of the thorax are intact. IMPRESSION: 1. Multiple right pulmonary nodules, likely granulomata, but CT chest without contrast recommended to confirm. 2. Right base collapse/consolidation with small right pleural effusion. 3. Enlargement of the cardiopericardial silhouette, likely cardiomegaly although pericardial effusion not excluded. Electronically Signed   By: Misty Stanley M.D.   On: 09/15/2016 20:32   Dg Lumbar Spine 2-3 Views  Result Date: 09/16/2016 CLINICAL DATA:  L4-5 laminectomy/foraminectomy. EXAM: LUMBAR SPINE - 2-3 VIEW; DG C-ARM 61-120 MIN COMPARISON:  MRI lumbar  spine 08/28/2016 FINDINGS: Two spot fluoroscopic images of the lumbar spine demonstrate a surgical instrument over the posterior elements at the L4-5 level. Known L4 compression fracture. Mild spondylosis of the spine. Disc space narrowing at the L5-S1 level. Surgical clips are present anterior to the spine likely from previous vascular surgery. IMPRESSION: Mild spondylosis of the lumbar spine with disc disease at the L5-S1 level. Known L4 compression fracture with surgical instrument over the posterior  elements at the L4-5 level. Electronically Signed   By: Marin Olp M.D.   On: 09/16/2016 18:54   Ct Head Wo Contrast  Result Date: 09/16/2016 CLINICAL DATA:  Pulmonary nodules, confusion EXAM: CT HEAD WITHOUT CONTRAST TECHNIQUE: Contiguous axial images were obtained from the base of the skull through the vertex without intravenous contrast. COMPARISON:  Same day chest CT FINDINGS: The study is compromised by motion artifacts. Brain: There is an ill-defined small right posterior parietal intra-axial hyperdensity seen, series 3, image 20 with an approximate 3.5 cm hypodense masslike abnormality in the right cerebellar hemisphere causing positive mass effect on the fourth ventricle. Findings are concerning for metastatic disease and edema given recent findings within the chest CT. More confluent areas of hypodensity are seen in the left frontal white matter likely representing transependymal spread of CSF. No extra-axial fluid collections are identified. Postobstructive dilatation on the lateral and third ventricles from mass effect on the fourth ventricle is noted. Vascular: No hyperdense vessel or unexpected calcification. Skull: Negative for focal lesions.  No fracture. Sinuses/Orbits: Limited by motion artifacts.  No acute finding. Other: None IMPRESSION: Intra-axial ill-defined hyperdensity in the posterior right parietal lobe may represent a small hemorrhagic metastasis. An additional 3.5 cm hypodense mass with adjacent edema in the right cerebellar hemisphere with positive mass effect on the fourth ventricle is also noted. These in conjunction CT chest findings are concerning for metastatic disease. MRI may help for further correlation. Postobstructive dilatation of the lateral third ventricles secondary to mass-effect of the fourth ventricle with transependymal spread of CSF in the left frontal lobe. Critical Value/emergent results were called by telephone at the time of interpretation on 09/16/2016 at  11:29 pm to Nurse Elita Boone, who verbally acknowledged these results and will be contacting the oncall Neurosurgeon immediately. Electronically Signed   By: Ashley Royalty M.D.   On: 09/16/2016 23:29   Ct Chest Wo Contrast  Result Date: 09/16/2016 CLINICAL DATA:  Confusion, pulmonary nodules EXAM: CT CHEST WITHOUT CONTRAST TECHNIQUE: Multidetector CT imaging of the chest was performed following the standard protocol without IV contrast. COMPARISON:  None. FINDINGS: Cardiovascular: Cardiomegaly with small pericardial effusion and coronary arteriosclerosis. There is aortic atherosclerosis with aneurysmal dilatation of the ascending aorta up to 4.2 cm. There is dilatation of the main pulmonary artery to 3.9 cm consistent with a component of pulmonary hypertension. Atherosclerosis in the proximal great vessels is identified. Mediastinum/Nodes: Necrotic appearing cervical adenopathy is seen at the base of the neck on the left with central areas of hypodensity. Prevascular adenopathy measuring up to 1.5 cm short axis Left lower paratracheal 1.7 cm short axis lymphadenopathy is noted. The trachea is patent. The left mainstem bronchus is patent. There is narrowing of the right mainstem bronchus and bronchi to the right middle and lower lobes secondary to a mass about the right hilum described below. Lungs/Pleura: There is a large masslike abnormality in the infrahilar right hemithorax abutting the right heart border and measuring roughly 11 x 7.3 x 5.8 cm in AP by transverse by CC  dimension. Exact margins are difficult to distinguish given lack of IV contrast and also from postobstructive changes to the right middle and lower lobes. Additionally there are calcified nodules in the right upper, middle and lower lobes consistent granulomas. Right upper lobe 4.1 x 3.1 mm noncalcified nodule, series 5, image 31, oblong 8.1 x 2.9 mm nodule series 5, image 35, posterior segment right upper lobe 5.3 mm nodule series 5, image  70, superior segment right lower lobe series 5, image 83 4.6 mm nodules are also seen potentially representing metastatic nodules or noncalcified granulomas in light of the above findings. Small right pleural effusion is present. Upper Abdomen: Calcified granuloma in the right middle lobe. Indeterminate 12 mm hypodense lesion in the left hepatic lobe series 4, image 165 and the right hepatic dome mm hypodensity, series 4, image 129 are noted. Mild thickening of the adrenal glands. Aortic and branch vessel atherosclerosis is identified within the upper abdomen. There is mild fullness of both renal collecting systems. Musculoskeletal: No suspicious lytic or blastic disease. IMPRESSION: 1. Large right perihilar and infrahilar masslike abnormality measuring 11 x 7.3 x 5.8 cm with mediastinal adenopathy. Associated small right pleural effusion with postobstructive consolidations in the right lower lobe. Findings are concerning for bronchogenic carcinoma with metastatic adenopathy. There appears be cervical necrotic adenopathy at the base of the neck on the left for which CT or MRI with IV contrast is also recommended for better assessment. 2. Calcified and noncalcified subcentimeter pulmonary nodules are noted predominantly within the right lung. A calcified nodules may simply reflect granulomas however the noncalcified nodules cannot exclude metastatic pulmonary nodules. 3. Indeterminate hypodense lesions in the liver measuring 7 mm on the right and 12 mm on the left. 4. Aortic atherosclerosis.  Coronary arteriosclerosis. 5. 4.2 cm ascending aortic aneurysm. Recommend annual imaging followup by CTA or MRA. This recommendation follows 2010 ACCF/AHA/AATS/ACR/ASA/SCA/SCAI/SIR/STS/SVM Guidelines for the Diagnosis and Management of Patients with Thoracic Aortic Disease. Circulation. 2010; 121: A416-S063 Electronically Signed   By: Ashley Royalty M.D.   On: 09/16/2016 23:11   Mr Jeri Cos KZ Contrast  Result Date:  09/22/2016 CLINICAL DATA:  Metastatic lung cancer, pre-surgical planning. EXAM: MRI HEAD WITHOUT AND WITH CONTRAST TECHNIQUE: Multiplanar, multiecho pulse sequences of the brain and surrounding structures were obtained without and with intravenous contrast. CONTRAST:  52m MULTIHANCE GADOBENATE DIMEGLUMINE 529 MG/ML IV SOLN COMPARISON:  Motion degraded MR exam 09/18/2016. Noncontrast head CT 09/16/2016. FINDINGS: Brain: Multiple intracranial metastatic deposits are seen, the largest of which are in the posterior fossa, described below. RIGHT-to-LEFT mass effect on the fourth ventricle results in BILATERAL tonsillar herniation, greater on the RIGHT, with obstructive hydrocephalus. RIGHT-to-LEFT shift at the level of the aqueduct is also noted, with mild upward transtentorial herniation, probably not yet obstructing. Significant enlargement of lateral and third ventricles is redemonstrated, with layering proteinaceous fluid and transependymal absorption. Multiple lesions are identified on post infusion imaging series 10 as follows: RIGHT lateral cerebellum, 42 x 29 x 29 mm. RIGHT posterior cerebellum, 42 x 21 x 17 mm. LEFT cerebellar white matter, 7 mm LEFT lateral cerebellar corticomedullary junction, 14 mm. RIGHT occipital lobe, 19 mm. RIGHT temporal lobe, near middle temporal gyrus, 13 mm. RIGHT frontal pole, 8 mm. LEFT inferior frontal cortex, with superficial spread along the dura, 13 x 14 mm. The RIGHT medial occipital lobe,   5 mm. RIGHT superficial parietal lobe posteriorly, 6 mm. Vascular: Flow voids are maintained. Many of the larger lesions demonstrate susceptibility consistent with acute hemorrhage.  A few additional scattered microhemorrhages could reflect chronic hypertensive cerebrovascular disease or additional nonenhancing metastases. Skull and upper cervical spine: No definite osseous metastases. Severe cervical spondylosis. Sinuses/Orbits: No layering fluid. LEFT maxillary sinus retention cyst.  BILATERAL cataract extraction. Other: None. IMPRESSION: At least ten enhancing intracranial metastases are identified on post infusion imaging. Some of these are hemorrhagic. The contiguous RIGHT cerebellar metastases, the largest of which is greater than 4 cm in longest dimension, combine to result in significant cerebellar mass effect and obstructive hydrocephalus at the level of the inferior fourth ventricle. This has not significantly progressed from priors. Electronically Signed   By: Staci Righter M.D.   On: 09/22/2016 14:10   Mr Jeri Cos PN Contrast  Addendum Date: 09/19/2016   ADDENDUM REPORT: 09/19/2016 21:59 ADDENDUM: Coronal and axial reformations at the sagittal CUBE now submitted. Electronically Signed   By: Elon Alas M.D.   On: 09/19/2016 21:59   Result Date: 09/19/2016 CLINICAL DATA:  Multiple brain metastasis, here for pre-surgical planning. EXAM: MRI HEAD WITHOUT AND WITH CONTRAST TECHNIQUE: Multiplanar, multiecho pulse sequences of the brain and surrounding structures were obtained without and with intravenous contrast. Coronal T2, sagittal and coronal T1 post gadolinium not obtained. CONTRAST:  22m MULTIHANCE GADOBENATE DIMEGLUMINE 529 MG/ML IV SOLN COMPARISON:  CT HEAD Sep 16, 2016 FINDINGS: Multiple sequences are moderately or severely motion degraded. Per technologist note, patient was confused and had difficulty remaining still and following directions. Maximum medications given and multiple sequences re-attempted. Severely motion degraded post gadolinium axial T1 sequences. INTRACRANIAL CONTENTS: Due to motion and lack of contrast, multiple intracranial metastasis or difficult to characterize in measurements are approximations. Multiple complex heterogeneous metastasis with susceptibility artifact consistent with blood products in T2 bright surrounding vasogenic edema as follows: 4.8 x 4.9 cm RIGHT cerebellum, 13 x 14 mm LEFT cerebellum, 5 mm LEFT cerebellum 11 x 9 mm RIGHT  temporal lobe, 19 x 17 mm RIGHT occipital lobe, 13 x 18 mm LEFT inferior frontal lobe. 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 Mild upwards and inferior cerebellar herniation. Mildly effaced pre pontine cistern. No supratentorial midline shift. A few additional scattered micro hemorrhages could represent metastasis or sequelae of chronic hypertension. Mild additional presumed chronic small vessel ischemic disease. No abnormal extra-axial fluid collections. VASCULAR: Normal major intracranial vascular flow voids present at skull base. SKULL AND UPPER CERVICAL SPINE: No abnormal sellar expansion. No suspicious calvarial bone marrow signal. Craniocervical junction maintained. Severe degenerative change of the included cervical spine SINUSES/ORBITS: Small LEFT maxillary mucosal retention cyst without paranasal sinus air-fluid levels. Mastoid air cells are well aerated. The included ocular globes and orbital contents are non-suspicious. Status post bilateral ocular lens implants. OTHER: Patient is edentulous. No abnormal extra-axial fluid collections. IMPRESSION: Limited, incomplete MRI of the brain, nondiagnostic post gadolinium axial T1 sequences, limiting detection of metastatic disease. At least 3 infratentorial and 3 supratentorial hemorrhagic metastasis (additional metastasis may be present). Dominant 4.8 x 4.9 cm RIGHT cerebellar metastasis results in moderate to severe obstructive hydrocephalus. These results will be called to the ordering clinician or representative by the Radiologist Assistant, and communication documented in the [employer] Dashboard. Electronically Signed: By: CElon AlasM.D. On: 09/18/2016 22:58   Nm Bone Scan Whole Body  Result Date: 09/18/2016 CLINICAL DATA:  Bronchogenic carcinoma.  L4 compression fracture. EXAM: NUCLEAR MEDICINE  WHOLE BODY BONE SCAN TECHNIQUE: Whole body  anterior and posterior images were obtained approximately 3 hours after intravenous injection of radiopharmaceutical. RADIOPHARMACEUTICALS:  25 MCi Technetium-82mMDP IV COMPARISON:  Lumbar spine MRI 08/28/2016 FINDINGS: Bladder and bilateral renal activity is present. There is a band of increased signal at the pathologic L4 body fracture. No abnormal signal elsewhere. IMPRESSION: Activity at the known L4 pathologic body fracture. No indication of osseous metastasis elsewhere. Electronically Signed   By: JMonte FantasiaM.D.   On: 09/18/2016 15:17   Dg C-arm 1-60 Min  Result Date: 09/16/2016 CLINICAL DATA:  L4-5 laminectomy/foraminectomy. EXAM: LUMBAR SPINE - 2-3 VIEW; DG C-ARM 61-120 MIN COMPARISON:  MRI lumbar spine 08/28/2016 FINDINGS: Two spot fluoroscopic images of the lumbar spine demonstrate a surgical instrument over the posterior elements at the L4-5 level. Known L4 compression fracture. Mild spondylosis of the spine. Disc space narrowing at the L5-S1 level. Surgical clips are present anterior to the spine likely from previous vascular surgery. IMPRESSION: Mild spondylosis of the lumbar spine with disc disease at the L5-S1 level. Known L4 compression fracture with surgical instrument over the posterior elements at the L4-5 level. Electronically Signed   By: DMarin OlpM.D.   On: 09/16/2016 18:54    Time Spent in minutes  25   DLouellen MolderM.D on 09/23/2016 at 10:48 AM  Between 7am to 7pm - Pager - [phone] After 7pm go to [email or web address] - password [facility] -  Office  [phone]

## 2016-09-23 NOTE — Progress Notes (Signed)
Occupational Therapy Treatment Patient Details Name: Zachary Mccarthy MRN: [id number] DOB: 12-31-1940 Today's Date: 09/23/2016    History of present illness Pt is a 76 y/o male s/p right L4-5 laminotomy/foraminotomy and L4 vertebral body biopsy. Pt has a past medical history of  Hyperlipidemia; Hypertension; Cataract extraction; and Vascular surgery. 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 Chest CT showed large mass with adenopathy and small pleural effusion with postobstructive right lobe consolidation. Also has calcified pulmonary nodules on the right. Hypodense lesions in the liver also found.   OT comments  Pt progressing well toward OT goals. He was able to complete toilet transfers with min assist and standing grooming tasks with min guard assist. Educated pt on back precautions during LB ADL and he was able to complete with min guard assist for safety and mod VC's to adhere to precautions. Pt with decreased motor planning and decreased short-term memory with little awareness of deficits this session. D/C plan remains appropriate and OT will continue to follow while admitted.   Follow Up Recommendations  SNF;Supervision/Assistance - 24 hour    Equipment Recommendations  Other (comment) (defer to next venue of care)    Recommendations for Other Services      Precautions / Restrictions Precautions Precautions: Fall;Back Precaution Booklet Issued: No Precaution Comments: reviewed back precautions with pt and wife Restrictions Weight Bearing Restrictions: No       Mobility Bed Mobility               General bed mobility comments: OOB in bathroom on OT arrival.  Transfers Overall transfer level: Needs assistance Equipment used: Rolling walker (2 wheeled) Transfers: Sit to/from  Stand Sit to Stand: Min guard         General transfer comment: Min guard assist for stability with VC's for safety and back precautions.    Balance Overall balance assessment: Needs assistance Sitting-balance support: Bilateral upper extremity supported Sitting balance-Leahy Scale: Good     Standing balance support: No upper extremity supported;Bilateral upper extremity supported Standing balance-Leahy Scale: Poor Standing balance comment: Physical assist or UE support required during standing ADL.                           ADL either performed or assessed with clinical judgement   ADL Overall ADL's : Needs assistance/impaired     Grooming: Wash/dry hands;Standing;Minimal assistance Grooming Details (indicate cue type and reason): VC's for sequencing throughout session. Poor motor planning noted as pt attempting to wash hands without turning on water.     Lower Body Bathing: Min guard;Sit to/from stand;Cueing for sequencing;Cueing for back precautions       Lower Body Dressing: Min guard;Sit to/from stand;Cueing for back precautions;Cueing for sequencing   Toilet Transfer: Minimal assistance;RW;Ambulation   Toileting- Clothing Manipulation and Hygiene: Min guard;Sit to/from stand       Functional mobility during ADLs: Minimal assistance;Rolling walker General ADL Comments: Up to min assist for stability at times when ambulating.      Vision       Perception     Praxis      Cognition Arousal/Alertness: Awake/alert Behavior During Therapy: Impulsive Overall Cognitive Status: Impaired/Different from baseline Area of Impairment: Attention;Orientation;Memory;Following commands;Safety/judgement;Problem solving;Awareness                 Orientation  Level: Disoriented to;Place;Situation ("This is [employer] right?") Current Attention Level: Selective Memory: Decreased recall of precautions;Decreased short-term memory Following Commands: Follows  one step commands inconsistently Safety/Judgement: Decreased awareness of safety;Decreased awareness of deficits Awareness: Emergent Problem Solving: Slow processing;Difficulty sequencing;Requires verbal cues;Requires tactile cues General Comments: Cueing for RW use. Pt with little understanding of deficits. Decreased short-term memory and poor attention impacting ability to follow commands.        Exercises     Shoulder Instructions       General Comments      Pertinent Vitals/ Pain       Pain Assessment: Faces Faces Pain Scale: Hurts a little bit Pain Location: legs Pain Descriptors / Indicators: Tiring Pain Intervention(s): Limited activity within patient's tolerance;Monitored during session;Repositioned  Home Living                                          Prior Functioning/Environment              Frequency  Min 2X/week        Progress Toward Goals  OT Goals(current goals can now be found in the care plan section)  Progress towards OT goals: Progressing toward goals  Acute Rehab OT Goals Patient Stated Goal: not expressed ADL Goals Pt Will Perform Upper Body Bathing: with supervision;sitting Pt Will Perform Lower Body Bathing: with min guard assist;with caregiver independent in assisting;sitting/lateral leans;with adaptive equipment Pt Will Perform Upper Body Dressing: with supervision;sitting Pt Will Perform Lower Body Dressing: with adaptive equipment;with caregiver independent in assisting;sit to/from stand;with min assist Pt Will Transfer to Toilet: with supervision;ambulating (with RW; caregiver independent in assisting) Pt Will Perform Toileting - Clothing Manipulation and hygiene: with supervision;sit to/from stand;with caregiver independent in assisting;with adaptive equipment Additional ADL Goal #1: Pt will recall 3/3 back precautions and maintain during ADL at supervision level Additional ADL Goal #2: Pt will perform bed mobility  as precursor to ADL at supervision level maintaining back precautions  Plan Discharge plan remains appropriate    Co-evaluation                 AM-PAC PT "6 Clicks" Daily Activity     Outcome Measure   Help from another person eating meals?: A Little Help from another person taking care of personal grooming?: A Little Help from another person toileting, which includes using toliet, bedpan, or urinal?: A Little Help from another person bathing (including washing, rinsing, drying)?: A Little Help from another person to put on and taking off regular upper body clothing?: A Little Help from another person to put on and taking off regular lower body clothing?: A Little 6 Click Score: 18    End of Session Equipment Utilized During Treatment: Gait belt;Rolling walker  OT Visit Diagnosis: Unsteadiness on feet (R26.81);Other abnormalities of gait and mobility (R26.89);Muscle weakness (generalized) (M62.81);Other symptoms and signs involving cognitive function Pain - Right/Left: Right Pain - part of body: Leg (back)   Activity Tolerance Patient tolerated treatment well   Patient Left in chair;with call bell/phone within reach;with chair alarm set;with family/visitor present   Nurse Communication Mobility status        Time: 2725-3664 OT Time Calculation (min): 27 min  Charges: OT General Charges $OT Visit: 1 Procedure OT Treatments $Self Care/Home Management : 23-37 mins  Norman Herrlich, MS OTR/L  Pager: [place]  09/23/2016, 1:33 PM

## 2016-09-23 NOTE — Clinical Social Work Note (Signed)
CSW has not heard back from [facility] (1st choice) as to if they can accept pt. CSW will follow up with [facility]. Pt not medically ready for d/c. CSW continuing to follow for d/c needs.  Zachary Mccarthy, Zachary Mccarthy, Zachary Mccarthy Work [phone]

## 2016-09-23 NOTE — Consult Note (Signed)
Consultation Note Date: 09/23/2016   Patient Name: Zachary Mccarthy  DOB: 1940/07/04  MRN: [id number]  Age / Sex: 76 y.o., male  PCP: [name], MD Referring Physician: [name], MD  Reason for Consultation: Establishing goals of care and Psychosocial/spiritual support  HPI/Patient Profile: 76 y.o. male admitted on 09/15/2016 with a past medical history of hypertension, hyperlipidemia, CKD  renal mass.     Admitted to neurosurgery service for severe right-sided back pain radiating to the right thigh and knee, progressive for the past 5 months severely limiting his mobility. (He is now wheelchair bound). MRI of the spine showed pathologic L4 compression fracture. On 5/1 ,Underwent right L4-5 laminectomy and foraminotomy with decompression, had L4 vertebral body biopsy.  CT of the chest shows large right perihilar and infrahilar mass with mediastinal adenopathy and small right pleural effusion with postobstructive right lobe consolidation. Also has calcified pulmonary nodules on the right. Hypodense lesions in the liver also found.  CT of the head done given findings of pulmonary nodules and some confusion showed right posterior parietal lobe hemorrhagic metastases with some mass effect.  Hospitalist consulting for acute confusion and other medical issues.  Patient has had  20 lb weight loss over the last 5 months. Family reports ongoing issues with leg and back pain for the past five months and increasing confusion.  Family is not aware of patient having been diagnosed with dementia.   As a family they  now face treatment option decisions,  advanced directive decisions and anticipatory care needs.   Clinical Assessment and Goals of Care:  This NP Wadie Lessen reviewed medical records, received report from team, assessed the patient and then meet at the patient's bedside along with his wife to  discuss diagnosis, prognosis, GOC, EOL wishes disposition and options.  A detailed discussion was had today regarding advanced directives.    Values and goals of care important to patient and family were attempted to be elicited.    Discussed with patient the importance of continued conversation with family and their  medical providers regarding overall plan of care and treatment options,  ensuring decisions are within the context of the patients values and GOCs.  Risk and benefits needs to be detailed and intention of therapies discussed.  Concept of  Palliative Care was discussed  Questions and concerns addressed.   Family encouraged to call with questions or concerns.  PMT will continue to support holistically.  NEXT OF KIN -- no documented HPOA or AD   SUMMARY OF RECOMMENDATIONS    Code Status/Advance Care Planning:  Full code- encouraged to consider DNR/DNI status knowing poor outcomes in similar patients   Palliative Prophylaxis:   Aspiration, Bowel Regimen, Delirium Protocol, Frequent Pain Assessment and Oral Care  Additional Recommendations (Limitations, Scope, Preferences):  Full Scope Treatment  Psycho-social/Spiritual:   Desire for further Chaplaincy support:yes   Prognosis:   Unable to determine  Discharge Planning: To Be Determined      Primary Diagnoses: Present on Admission: . Pathologic compression fracture of  spine ([place])   I have reviewed the medical record, interviewed the patient and family, and examined the patient. The following aspects are pertinent.  Past Medical History:  Diagnosis Date  . Compression fracture of spine ([place]) 08/2016  . Hyperlipidemia   . Hypertension    Social History   Social History  . Marital status: Married    Spouse name: N/A  . Number of children: N/A  . Years of education: N/A   Social History Main Topics  . Smoking status: Former Research scientist (life sciences)  . Smokeless tobacco: Never Used     Comment: quit smoking in the  80's   . Alcohol use No  . Drug use: No  . Sexual activity: Not Asked   Other Topics Concern  . None   Social History Narrative  . None   Family History  Problem Relation Age of Onset  . Family history unknown: Yes   Scheduled Meds: . amLODipine  10 mg Oral Daily  . carvedilol  25 mg Oral BID WC  . dexamethasone  4 mg Intravenous Q6H  . docusate sodium  100 mg Oral BID  . ezetimibe  10 mg Oral Daily  . feeding supplement (ENSURE ENLIVE)  237 mL Oral TID BM  . gabapentin  300 mg Oral TID  . pantoprazole  40 mg Oral QHS  . senna  1 tablet Oral BID  . simvastatin  20 mg Oral q1800   Continuous Infusions: . lactated ringers 50 mL/hr at 09/15/16 1813  . lactated ringers Stopped (09/22/16 1400)  . methocarbamol (ROBAXIN)  IV     PRN Meds:.bisacodyl, fentaNYL (SUBLIMAZE) injection, hydrALAZINE, HYDROcodone-acetaminophen, menthol-cetylpyridinium **OR** phenol, meperidine (DEMEROL) injection, methocarbamol **OR** methocarbamol (ROBAXIN)  IV, morphine injection, ondansetron **OR** ondansetron (ZOFRAN) IV, polyethylene glycol, promethazine, sodium phosphate Medications Prior to Admission:  Prior to Admission medications   Medication Sig Start Date End Date Taking? Authorizing Provider  acetaminophen (TYLENOL) 500 MG tablet Take 500 mg by mouth every 6 (six) hours as needed for mild pain.   Yes [provider]  amLODipine (NORVASC) 10 MG tablet Take 10 mg by mouth daily.   Yes [provider]  carvedilol (COREG) 25 MG tablet Take 25 mg by mouth 2 (two) times daily with a meal.   Yes [provider]  clopidogrel (PLAVIX) 75 MG tablet Take 75 mg by mouth daily.   Yes [provider]  ezetimibe-simvastatin (VYTORIN) 10-20 MG tablet Take 1 tablet by mouth daily.   Yes [provider]   Allergies  Allergen Reactions  . No Known Allergies    Review of Systems  Neurological: Positive for weakness.  All other systems reviewed and are  negative.   Physical Exam  Constitutional: He appears cachectic. He appears ill.  Cardiovascular: Normal rate, regular rhythm and normal heart sounds.   Pulmonary/Chest: He has decreased breath sounds in the right lower field and the left lower field.  Neurological: He is alert.  - oriented to person and place, intermittent confusion  Skin: Skin is warm and dry.    Vital Signs: BP (!) 144/62 (BP Location: Right Arm)   Pulse 68   Temp 97.8 F (36.6 C) (Oral)   Resp 16   Ht '5\' 9"'$  (1.753 m)   Wt 66.8 kg (147 lb 4.8 oz)   SpO2 92%   BMI 21.75 kg/m  Pain Assessment: No/denies pain   Pain Score: Asleep   SpO2: SpO2: 92 % O2 Device:SpO2: 92 % O2 Flow Rate: .  IO: Intake/output summary:  Intake/Output Summary (Last 24 hours) at 09/23/16 0938 Last data filed at 09/23/16 7129  Gross per 24 hour  Intake              590 ml  Output                0 ml  Net              590 ml    LBM: Last BM Date: 09/21/16 Baseline Weight: Weight: 63.2 kg (139 lb 6.4 oz) Most recent weight: Weight: 66.8 kg (147 lb 4.8 oz)      Palliative Assessment/Data: 40 %    Flowsheet Rows     Most Recent Value  Intake Tab  Referral Department  Neurology  Unit at Time of Referral  Med/Surg Unit  Palliative Care Primary Diagnosis  Cancer  Date Notified  09/23/16  Palliative Care Type  New Palliative care  Reason for referral  Clarify Goals of Care  Date of Admission  09/15/16  Date first seen by Palliative Care  09/23/16  # of days Palliative referral response time  0 Day(s)  # of days IP prior to Palliative referral  8  Clinical Assessment  Palliative Performance Scale Score  50%  Psychosocial & Spiritual Assessment  Palliative Care Outcomes     Discussed with Dr Clementeen Graham Time In: 0830 Time Out: 0945 Time Total: 75 min Greater than 50%  of this time was spent counseling and coordinating care related to the above assessment and plan.  Signed by: Wadie Lessen, NP   Please contact  Palliative Medicine Team phone at [phone] for questions and concerns.  For individual provider: See Shea Evans

## 2016-09-24 NOTE — Progress Notes (Addendum)
Nutrition Follow Up  DOCUMENTATION CODES:   Severe malnutrition in context of chronic illness  INTERVENTION:    Continue Ensure Enlive po TID, each supplement provides 350 kcal and 20 grams of protein  NUTRITION DIAGNOSIS:   Malnutrition (Severe) related to chronic illness (cancer) as evidenced by severe depletion of body fat, severe depletion of muscle mass, energy intake < or equal to 75% for > or equal to 1 month, percent weight loss, ongoing  GOAL:   Patient will meet greater than or equal to 90% of their needs, met  MONITOR:   PO intake, Supplement acceptance, Labs, Weight trends, I & O's  ASSESSMENT:   Pt with hx HTN, hyperlipidemia, CKD stage 3, dementia, and s/p TURBT for noninvasive bladder cancer admitted directly from Dr Kathyrn Sheriff office for pathologic L4 compression fx. Plan for L4-5 decompression with biopsy of the L4.    Pt with metastatic [place] lung cancer, large cerebellar met with hydrocephalus. Continues on a Regular diet.  PO intake 100% per flowsheets. Receiving Ensure Enlive nutrition supplements; is drinking. Medications reviewed.  Labs reviewed.  Sodium 132 (L). Plan for surgery Monday, 5/14.  Diet Order:  Diet regular Room service appropriate? Yes; Fluid consistency: Thin  Skin:  Reviewed, no issues  Last BM:  5/8  Height:   Ht Readings from Last 1 Encounters:  09/16/16 5' 9"  (1.753 m)   Weight:   Wt Readings from Last 1 Encounters:  09/16/16 147 lb 4.8 oz (66.8 kg)   Ideal Body Weight:  72.7 kg  BMI:  Body mass index is 21.75 kg/m.  Estimated Nutritional Needs:   Kcal:  1700-1900  Protein:  85-100 grams  Fluid:  > 1.6 L/day  EDUCATION NEEDS:   No education needs identified at this time  Arthur Holms, RD, LDN Pager #: [phone] After-Hours Pager #: [phone]

## 2016-09-24 NOTE — Progress Notes (Signed)
Patient ID: Zachary Mccarthy, male   DOB: April 21, 1941, 76 y.o.   MRN: [id number]  This NP visited patient at the bedside as a follow up to  yesterday's [place].  Wife at bedside, she verbalizes hope for return to baseline.  Plan continues to be [facility] Friday  at [facility] and surgical resection on Monday.   Questions and concerns addressed.  Emotional support offered.  Chaplain involved.  Offered to include daughter in a family meeting next week after surgery.  Discussed anticipatory care needs and possible SNF for rehab when stable  Discussed with patient/family the importance of continued conversation with family and their  medical providers regarding overall plan of care and treatment options,  ensuring decisions are within the context of the patients values and GOCs.  I explained to Mrs. Silliman that I will be out of the hospital Friday thru the weekend and will return on Monday.  They can contact the PMT at anytime; they have team phone information.     I will f/u with them on Monday for continued holistic support.  Time in   0915        Time out  0945  Total time spent on the unit  30 minutes  Greater than 50% of the time was spent in counseling and coordination of care  Wadie Lessen NP  Palliative Medicine Team Team Phone # [phone] Pager [phone]

## 2016-09-24 NOTE — Progress Notes (Signed)
No issues overnight. Pt reports no significant leg pain.   EXAM:  BP (!) 151/59 (BP Location: Right Arm)   Pulse 72   Temp 98.3 F (36.8 C) (Oral)   Resp 16   Ht '5\' 9"'$  (1.753 m)   Wt 147 lb 4.8 oz (66.8 kg)   SpO2 100%   BMI 21.75 kg/m   Awake, alert, oriented  Speech fluent, appropriate  CN grossly intact  5/5 BUE/BLE   IMPRESSION:  76 y.o. male with metastatic non-small cell lung CA, multiple brain mets including large right cerebellar lesion  PLAN: - Still planning of SRS treatment on Friday am, surgery on Monday am.  I have reviewed the plan above with the patient and his wife. Risks of surgery were discussed in detail including stroke leading to numbness/tingling/weakness/paralysis/coma/death, hemorrhage, CSF leak, bleeding, infection, hydrocephalus, and need for additional surgeries. General risks of anesthesia were reviewed to include DVT/PE, MI, and stroke. PT and wife appeared to understand. All questions were answered. At this point they want to proceed with treatment as above.

## 2016-09-24 NOTE — Progress Notes (Signed)
Physical Therapy Treatment Patient Details Name: Zachary Mccarthy MRN: [id number] DOB: 26-Aug-1940 Today's Date: 09/24/2016    History of Present Illness Pt is a 76 y/o male s/p right L4-5 laminotomy/foraminotomy and L4 vertebral body biopsy. Pt has a past medical history of  Hyperlipidemia; Hypertension; Cataract extraction; and Vascular surgery. 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 Chest CT showed large mass with adenopathy and small pleural effusion with postobstructive right lobe consolidation. Also has calcified pulmonary nodules on the right. Hypodense lesions in the liver also found.    PT Comments    PT transfer goal updated this session and pt is progressing toward other goals.  He required supervision for most mobility this session with emphasis on RW management and precaution maintenance.  He was able to recall 1/3 back precautions.  He is still slightly impulsive, but less than previous sessions.  He seemed to have a postive and playful disposition today.  Current recommendations remain appropriate, as he will benefit from continued skilled therapy to maximize return to PLOF and reduce burden of care for wife at home.  Will continue to follow acutely.   Follow Up Recommendations  SNF;Supervision/Assistance - 24 hour     Equipment Recommendations  None recommended by PT    Recommendations for Other Services       Precautions / Restrictions Precautions Precautions: Fall;Back Precaution Comments: pt needed VC for back precautions in context with ADL activity  Restrictions Weight Bearing Restrictions: No    Mobility  Bed Mobility               General bed mobility comments: Pt was sitting up in recliner upon OT arrival.   Transfers Overall transfer level: Needs  assistance Equipment used: Rolling walker (2 wheeled) Transfers: Sit to/from [facility] Sit to Stand: Min assist Stand pivot transfers: Min assist       General transfer comment: VC for back precautions and hand placement  Ambulation/Gait Ambulation/Gait assistance: Min guard Ambulation Distance (Feet): 300 Feet Assistive device: Rolling walker (2 wheeled) Gait Pattern/deviations: Step-through pattern;Trunk flexed;Narrow base of support Gait velocity: normal Gait velocity interpretation: at or above normal speed for age/gender General Gait Details: VC's fpr posture and walker proximity.  Pt slightly impulsive with walker use in room, stepping outside of it and reminded to stay inside.   Stairs            Wheelchair Mobility    Modified Rankin (Stroke Patients Only)       Balance Overall balance assessment: Needs assistance Sitting-balance support: No upper extremity supported;Feet supported Sitting balance-Leahy Scale: Good     Standing balance support: Bilateral upper extremity supported Standing balance-Leahy Scale: Poor Standing balance comment: Reliant on RW for stability.                            Cognition Arousal/Alertness: Awake/alert Behavior During Therapy: Impulsive Overall Cognitive Status: Impaired/Different from baseline Area of Impairment: Attention;Memory;Following commands;Safety/judgement;Problem solving                   Current Attention Level: Selective Memory: Decreased recall of precautions;Decreased short-term memory Following Commands: Follows one step commands consistently Safety/Judgement: Decreased awareness of safety;Decreased awareness of deficits   Problem Solving: Slow processing;Difficulty sequencing;Requires verbal cues;Requires tactile cues  Exercises      General Comments General comments (skin integrity, edema, etc.): Wife present this session      Pertinent Vitals/Pain  Pain Assessment: 0-10 Pain Score: 4  Pain Location: legs Pain Descriptors / Indicators: Sore Pain Intervention(s): Limited activity within patient's tolerance;Repositioned;Monitored during session;Relaxation    Home Living                      Prior Function            PT Goals (current goals can now be found in the care plan section) Acute Rehab PT Goals Patient Stated Goal: not expressed PT Goal Formulation: With patient/family Time For Goal Achievement: 10/01/16 Potential to Achieve Goals: Good Progress towards PT goals: Progressing toward goals    Frequency    Min 5X/week      PT Plan Current plan remains appropriate    Co-evaluation              AM-PAC PT "6 Clicks" Daily Activity  Outcome Measure  Difficulty turning over in bed (including adjusting bedclothes, sheets and blankets)?: A Little Difficulty moving from lying on back to sitting on the side of the bed? : A Lot Difficulty sitting down on and standing up from a chair with arms (e.g., wheelchair, bedside commode, etc,.)?: Total Help needed moving to and from a bed to chair (including a wheelchair)?: A Little Help needed walking in hospital room?: A Little Help needed climbing 3-5 steps with a railing? : A Little 6 Click Score: 15    End of Session Equipment Utilized During Treatment: Gait belt Activity Tolerance: Patient tolerated treatment well Patient left: in chair;with call bell/phone within reach;with chair alarm set;with family/visitor present Nurse Communication: Mobility status PT Visit Diagnosis: Unsteadiness on feet (R26.81);Muscle weakness (generalized) (M62.81);Difficulty in walking, not elsewhere classified (R26.2)     Time: 2297-9892 PT Time Calculation (min) (ACUTE ONLY): 19 min  Charges:                       G Codes:       Gaetano Net SPT   Gaetano Net 09/24/2016, 2:14 PM

## 2016-09-24 NOTE — Progress Notes (Signed)
Occupational Therapy Treatment Patient Details Name: Zachary Mccarthy MRN: [id number] DOB: [birth year] Today's Date: 09/24/2016    History of present illness Pt is a 76 y/o male s/p right L4-5 laminotomy/foraminotomy and L4 vertebral body biopsy. Pt has a past medical history of  Hyperlipidemia; Hypertension; Cataract extraction; and Vascular surgery. 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 Chest CT showed large mass with adenopathy and small pleural effusion with postobstructive right lobe consolidation. Also has calcified pulmonary nodules on the right. Hypodense lesions in the liver also found.   OT comments  Pt stated he had walked earlier and was tired.  Pt did participate with OT with lots of encouragement  Follow Up Recommendations  SNF;Supervision/Assistance - 24 hour    Equipment Recommendations  Other (comment) (defer to next venue of care)       Precautions / Restrictions Precautions Precautions: Fall;Back Precaution Comments: pt needed VC for back precautions in context with ADL activity  Restrictions Weight Bearing Restrictions: No       Mobility Bed Mobility               General bed mobility comments: Pt was sitting up in recliner upon OT arrival.   Transfers Overall transfer level: Needs assistance Equipment used: Rolling walker (2 wheeled) Transfers: Sit to/from [facility] Sit to Stand: Min assist Stand pivot transfers: Min assist       General transfer comment: VC for back precautions and hand placement    Balance Overall balance assessment: Needs assistance Sitting-balance support: No upper extremity supported;Feet supported Sitting balance-Leahy Scale: Good     Standing balance support: Bilateral upper extremity supported Standing  balance-Leahy Scale: Poor Standing balance comment: Reliant on RW for stability.                           ADL either performed or assessed with clinical judgement   ADL Overall ADL's : Needs assistance/impaired     Grooming: Wash/dry hands;Standing;Min guard;Wash/dry face               Lower Body Dressing: Sit to/from stand;Cueing for back precautions;Cueing for sequencing;Minimal assistance   Toilet Transfer: Minimal assistance;RW;Ambulation   Toileting- Clothing Manipulation and Hygiene: Sit to/from stand;Minimal assistance       Functional mobility during ADLs: Minimal assistance;Rolling walker                 Cognition Arousal/Alertness: Awake/alert Behavior During Therapy: Impulsive Overall Cognitive Status: Impaired/Different from baseline Area of Impairment: Attention;Memory;Following commands;Safety/judgement;Problem solving                   Current Attention Level: Selective Memory: Decreased recall of precautions;Decreased short-term memory Following Commands: Follows one step commands consistently Safety/Judgement: Decreased awareness of safety;Decreased awareness of deficits   Problem Solving: Slow processing;Difficulty sequencing;Requires verbal cues;Requires tactile cues                General Comments Wife present this session    Pertinent Vitals/ Pain       Pain Assessment: 0-10 Pain Score: 4  Pain Location: legs Pain Descriptors / Indicators: Sore Pain Intervention(s): Limited activity within patient's tolerance;Repositioned;Monitored during session;Relaxation                   Frequency  Min 2X/week  Progress Toward Goals  OT Goals(current goals can now be found in the care plan section)  Progress towards OT goals: Progressing toward goals  Acute Rehab OT Goals Patient Stated Goal: not expressed  Plan Discharge plan remains appropriate       AM-PAC PT "6 Clicks" Daily Activity     Outcome  Measure   Help from another person eating meals?: A Little Help from another person taking care of personal grooming?: A Little Help from another person toileting, which includes using toliet, bedpan, or urinal?: A Little Help from another person bathing (including washing, rinsing, drying)?: A Little Help from another person to put on and taking off regular upper body clothing?: A Little Help from another person to put on and taking off regular lower body clothing?: A Little 6 Click Score: 18    End of Session Equipment Utilized During Treatment: Gait belt;Rolling walker  OT Visit Diagnosis: Unsteadiness on feet (R26.81);Other abnormalities of gait and mobility (R26.89);Muscle weakness (generalized) (M62.81);Other symptoms and signs involving cognitive function Pain - Right/Left: Right Pain - part of body: Leg (back)   Activity Tolerance Patient tolerated treatment well   Patient Left in chair;with call bell/phone within reach;with chair alarm set;with family/visitor present   Nurse Communication Mobility status        Time: 1328-1340 OT Time Calculation (min): 12 min  Charges: OT General Charges $OT Visit: 1 Procedure OT Treatments $Self Care/Home Management : 8-22 mins  Kari Baars, [place]   Payton Mccallum D 09/24/2016, 2:07 PM

## 2016-09-25 ENCOUNTER — Ambulatory Visit: Payer: Medicare Other | Admitting: Radiation Oncology

## 2016-09-25 ENCOUNTER — Telehealth: Payer: Self-pay | Admitting: *Deleted

## 2016-09-25 NOTE — Progress Notes (Signed)
Physical Therapy Treatment Patient Details Name: Zachary Mccarthy MRN: [id number] DOB: 1940/08/01 Today's Date: 09/25/2016    History of Present Illness Pt is a 76 y/o male s/p right L4-5 laminotomy/foraminotomy and L4 vertebral body biopsy. Pt has a past medical history of  Hyperlipidemia; Hypertension; Cataract extraction; and Vascular surgery. 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 Chest CT showed large mass with adenopathy and small pleural effusion with postobstructive right lobe consolidation. Also has calcified pulmonary nodules on the right. Hypodense lesions in the liver also found.    PT Comments    Pt ambulation goal met and updated this session. He is progressing toward other goals, requiring supervision for transfers and education with an emphasis on back precautions.  Pt able to recall 1/3 precautions.  He remains impulsive at times, requiring cues for RW management and precaution maintenance.  Current plan remains appropriate.  Will continue to follow acutely.   Follow Up Recommendations  SNF;Supervision/Assistance - 24 hour     Equipment Recommendations  None recommended by PT    Recommendations for Other Services       Precautions / Restrictions Precautions Precautions: Fall;Back Precaution Booklet Issued: No Precaution Comments: Pt only able to recall that he is not supposed to bend.  Pt reminded of other precautions (Lift and twist). Restrictions Weight Bearing Restrictions: No    Mobility  Bed Mobility               General bed mobility comments: Pt was sitting up in recliner upon PT arrival.   Transfers Overall transfer level: Needs assistance Equipment used: Rolling walker (2 wheeled) Transfers: Sit to/from Stand Sit to Stand: Supervision          General transfer comment: VC for back precautions and hand placement  Ambulation/Gait Ambulation/Gait assistance: Supervision Ambulation Distance (Feet): 350 Feet Assistive device: Rolling walker (2 wheeled) Gait Pattern/deviations: Step-through pattern;Trunk flexed;Narrow base of support Gait velocity: normal Gait velocity interpretation: at or above normal speed for age/gender General Gait Details: VC's for posture and walker proximity.  Pt slightly impulsive with walker use in room, stepping outside of it and reminded to stay inside.   Stairs            Wheelchair Mobility    Modified Rankin (Stroke Patients Only)       Balance Overall balance assessment: Needs assistance Sitting-balance support: No upper extremity supported;Feet supported Sitting balance-Leahy Scale: Good     Standing balance support: Bilateral upper extremity supported Standing balance-Leahy Scale: Poor Standing balance comment: Reliant on RW for stability.                            Cognition Arousal/Alertness: Awake/alert Behavior During Therapy: Impulsive Overall Cognitive Status: Impaired/Different from baseline Area of Impairment: Attention;Memory;Following commands;Safety/judgement;Problem solving                   Current Attention Level: Selective Memory: Decreased recall of precautions;Decreased short-term memory Following Commands: Follows one step commands consistently Safety/Judgement: Decreased awareness of safety;Decreased awareness of deficits Awareness: Emergent Problem Solving: Slow processing;Difficulty sequencing;Requires verbal cues General Comments: Difficulty making turn with RW.  At one point pt fixated on determining the cardinal directions while walking. (N, S, E, W)      Exercises      General Comments General  comments (skin integrity, edema, etc.): wife present      Pertinent Vitals/Pain Pain Assessment: Faces Faces Pain Scale: Hurts a  little bit Pain Location: legs Pain Descriptors / Indicators: Sore Pain Intervention(s): Limited activity within patient's tolerance;Monitored during session;Repositioned    Home Living                      Prior Function            PT Goals (current goals can now be found in the care plan section) Acute Rehab PT Goals Patient Stated Goal: not expressed PT Goal Formulation: With patient/family Time For Goal Achievement: 10/01/16 Potential to Achieve Goals: Good Progress towards PT goals: Progressing toward goals    Frequency    Min 5X/week      PT Plan Current plan remains appropriate    Co-evaluation              AM-PAC PT "6 Clicks" Daily Activity  Outcome Measure  Difficulty turning over in bed (including adjusting bedclothes, sheets and blankets)?: A Little Difficulty moving from lying on back to sitting on the side of the bed? : A Lot Difficulty sitting down on and standing up from a chair with arms (e.g., wheelchair, bedside commode, etc,.)?: Total Help needed moving to and from a bed to chair (including a wheelchair)?: A Little Help needed walking in hospital room?: A Little Help needed climbing 3-5 steps with a railing? : A Little 6 Click Score: 15    End of Session Equipment Utilized During Treatment: Gait belt Activity Tolerance: Patient tolerated treatment well Patient left: in chair;with call bell/phone within reach;with family/visitor present Nurse Communication: Mobility status PT Visit Diagnosis: Unsteadiness on feet (R26.81);Muscle weakness (generalized) (M62.81);Difficulty in walking, not elsewhere classified (R26.2)     Time: 3846-6599 PT Time Calculation (min) (ACUTE ONLY): 21 min  Charges:  $Gait Training: 8-22 mins                    G Codes:       Gaetano Net SPT   Gaetano Net 09/25/2016, 2:39 PM

## 2016-09-25 NOTE — Progress Notes (Signed)
No issues overnight. 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

## 2016-09-25 NOTE — Telephone Encounter (Signed)
Called [facility] 5C-bed 18, spoke with RN Nira Conn, asked that patient be transported via [facility] for tomorrow  at [facility] center no later than 8am for [facility] tx  approximately lasting 1 hour, asked if that has been stet up as yet, "no, and they have [place]  Phone number stated RN", she will make sure that is arranged ,thanked RN\12:31 PM

## 2016-09-25 NOTE — Care Management Note (Addendum)
Case Management Note  Patient Details  Name: Zachary Mccarthy MRN: [id number] Date of Birth: [birth year]  Subjective/Objective:                    Action/Plan: Plan is for [facility] tomorrow and Craniotomy on Monday. CM following for d/c needs post surgery. Plan is for SNF. CSW aware.   Expected Discharge Date:                  Expected Discharge Plan:     In-House Referral:     Discharge planning Services     Post Acute Care Choice:    Choice offered to:     DME Arranged:    DME Agency:     HH Arranged:    HH Agency:     Status of Service:  In process, will continue to follow  If discussed at Long Length of Stay Meetings, dates discussed:    Additional Comments:  Pollie Friar, RN 09/25/2016, 11:18 AM

## 2016-09-26 ENCOUNTER — Ambulatory Visit: Payer: Medicare Other | Admitting: Radiation Oncology

## 2016-09-26 ENCOUNTER — Ambulatory Visit
Admit: 2016-09-26 | Discharge: 2016-09-26 | Disposition: A | Discharge status: Home / Self Care | Payer: Medicare Other | Attending: Radiation Oncology | Admitting: Radiation Oncology

## 2016-09-26 ENCOUNTER — Telehealth: Payer: Self-pay | Admitting: *Deleted

## 2016-09-26 DIAGNOSIS — C7931 Secondary malignant neoplasm of brain: Secondary | ICD-10-CM

## 2016-09-26 NOTE — Progress Notes (Signed)
Patient transported to [facility] by [place].

## 2016-09-26 NOTE — Progress Notes (Signed)
  Radiation Oncology         [phone] ________________________________  Name: Zachary Mccarthy MRN: [id number]  Date: 09/19/2016  DOB: [birth year]  DIAGNOSIS:     ICD-9-CM ICD-10-CM   1. Brain metastasis ([place]) 198.3 C79.31     NARRATIVE:  The patient was brought to the [place].  Identity was confirmed.  All relevant records and images related to the planned course of therapy were reviewed.  The patient freely provided informed written consent to proceed with treatment after reviewing the details related to the planned course of therapy. The consent form was witnessed and verified by the simulation staff. Intravenous access was established for contrast administration. Then, the patient was set-up in a stable reproducible supine position for radiation therapy.  A relocatable thermoplastic stereotactic head frame was fabricated for precise immobilization.  CT images were obtained.  Surface markings were placed.  The CT images were loaded into the planning software and fused with the patient's targeting MRI scan.  Then the target and avoidance structures were contoured.  Treatment planning then occurred.  The radiation prescription was entered and confirmed.  I have requested 3D planning  I have requested a DVH of the following structures: Brain stem, brain, left eye, right eye, lenses, optic chiasm, target volumes, uninvolved brain, and normal tissue.    SPECIAL TREATMENT PROCEDURE:  The planned course of therapy using radiation constitutes a special treatment procedure. Special care is required in the management of this patient for the following reasons. This treatment constitutes a Special Treatment Procedure for the following reason: High dose per fraction requiring special monitoring for increased toxicities of treatment including daily imaging.  The special nature of the planned course of radiotherapy will require increased physician supervision and oversight to ensure patient's  safety with optimal treatment outcomes.  PLAN:  The patient will receive 14 Gy in 1 fraction to the right cerebellar tumor and the patient will receive 20 gray in 1 fraction concurrently to 8 additional intracranial metastases.   ------------------------------------------------  Zachary Gross, MD, PhD

## 2016-09-26 NOTE — Telephone Encounter (Signed)
Spoke with [facility] dispatcher ,patient is ready to trasnport back to [facility] 5_C neuro surgical, dispatcher will send trasnport 8:51 AM

## 2016-09-26 NOTE — Progress Notes (Signed)
Patient is back from [place] long

## 2016-09-26 NOTE — Progress Notes (Signed)
PT Cancellation Note  Patient Details Name: Zachary Mccarthy MRN: [id number] DOB: September 12, 1940   Cancelled Treatment:    Reason Eval/Treat Not Completed: Patient at procedure or test/unavailable. Pt off-site for radiation treatment this morning. Will check back as schedule allows to continue with PT POC. Noted pt to go to OR Monday - will need new orders s/p surgery.   Thelma Comp 09/26/2016, 12:24 PM   Rolinda Roan, PT, DPT Acute Rehabilitation Services Pager: [phone]

## 2016-09-26 NOTE — Progress Notes (Signed)
  Radiation Oncology         [phone] ________________________________  Name: Zachary Mccarthy MRN: [id number]  Date: 09/26/2016  DOB: 01-Sep-1940   SPECIAL TREATMENT PROCEDURE   3D TREATMENT PLANNING AND DOSIMETRY: The patient's radiation plan was reviewed and approved by Dr. Elenor Legato from neurosurgery and radiation oncology prior to treatment. It showed 3-dimensional radiation distributions overlaid onto the planning CT/MRI image set. The [facility] for the target structures as well as the organs at risk were reviewed. The documentation of the 3D plan and dosimetry are filed in the radiation oncology EMR.   NARRATIVE: The patient was brought to the True[employer] stereotactic radiation treatment machine and placed supine on the CT couch. The head frame was applied, and the patient was set up for stereotactic radiosurgery. Neurosurgery was present for the set-up and delivery   SIMULATION VERIFICATION: In the couch zero-angle position, the patient underwent Exactrac imaging using the Brainlab system with orthogonal KV images. These were carefully aligned and repeated to confirm treatment position for each of the isocenters. The Exactrac snap film verification was repeated at each couch angle.   SPECIAL TREATMENT PROCEDURE: The patient received stereotactic radiosurgery to the following targets:  1.  PTV1-7, PTV9 targets were treated using 26 Arcs to a prescription dose of 20 Gy. ExacTrac Snap verification was performed for each couch angle.  2.  PTV8 target was treated using 5 Arcs to a prescription dose of 14 Gy. ExacTrac Snap verification was performed for each couch angle.  STEREOTACTIC TREATMENT MANAGEMENT: Following delivery, the patient was transported to nursing in stable condition and monitored for possible acute effects. Vital signs were recorded . The patient tolerated treatment without significant acute effects, and was discharged to home in stable condition.  PLAN: The patient will proceed with  surgical resection of the right cerebellar mass on Monday. We will have the patient return to our clinic in 1 month for routine follow-up.   ------------------------------------------------  Jodelle Gross, MD, PhD

## 2016-09-26 NOTE — Op Note (Signed)
  Name: Zachary Mccarthy  MRN: [id number]  Date: 09/15/2016   DOB: Apr 18, 1941  Stereotactic Radiosurgery Operative Note  PRE-OPERATIVE DIAGNOSIS:  Multiple Brain Metastases  POST-OPERATIVE DIAGNOSIS:  Multiple Brain Metastases  PROCEDURE:  Stereotactic Radiosurgery  SURGEON:  Jairo Ben, MD  RADIATION ONCOLOGIST: Kyung Rudd, MD  NARRATIVE: The patient underwent a radiation treatment planning session in the radiation oncology simulation suite under the care of the radiation oncology physician and physicist.  I participated closely in the radiation treatment planning afterwards. The patient underwent planning CT which was fused to 3T high resolution MRI with 1 mm axial slices.  These images were fused on the planning system.  We contoured the gross target volumes and subsequently expanded this to yield the Planning Target Volume. I actively participated in the planning process.  I helped to define and review the target contours and also the contours of the optic pathway, eyes, brainstem and selected nearby organs at risk.  All the dose constraints for critical structures were reviewed and compared to AAPM Task Group 101.  The prescription dose conformity was reviewed.  I approved the plan electronically.    Accordingly, [name] was brought to the TrueBeam stereotactic radiation treatment linac and placed in the custom immobilization mask.  The patient was aligned according to the IR fiducial markers with BrainLab Exactrac, then orthogonal x-rays were used in ExacTrac with the 6DOF robotic table and the shifts were made to align the patient  [name] received stereotactic radiosurgery uneventfully.    Lesions treated:  9 (note the 2 contiguous large right cerebellar lesions were treated as one)  Complex lesions treated:  1 (>3.5 cm, <35m of optic path, or within the brainstem)   The detailed description of the procedure is recorded in the radiation oncology procedure note.  I was  present for the duration of the procedure.  DISPOSITION:  Following delivery, the patient was transported to nursing in stable condition and monitored for possible acute effects to be discharged to home in stable condition with follow-up in one month.  [name] MD 09/26/2016 9:32 AM

## 2016-09-26 NOTE — Progress Notes (Signed)
No issues overnight. Pt seen this am at [facility] prior to [facility] treatment. No complaints.  EXAM:  BP (!) 155/69 (BP Location: Right Arm)   Pulse 71   Temp 98.6 F (37 C) (Oral)   Resp 20   Ht '5\' 9"'$  (1.753 m)   Wt 147 lb 4.8 oz (66.8 kg)   SpO2 100%   BMI 21.75 kg/m   Awake, alert, oriented  Speech fluent, appropriate  CN grossly intact  5/5 BUE/BLE   IMPRESSION:  76 y.o. male with metastatic lung CA  PLAN: - Proceed with SRS to all brain lesions today - Surgery Monday am.

## 2016-09-27 NOTE — Progress Notes (Signed)
Pt seen and examined.  No issues overnight. Denies any concerns  EXAM: Temp:  [97.5 F (36.4 C)-99.2 F (37.3 C)] 99.2 F (37.3 C) (05/12 0531) Pulse Rate:  [63-73] 72 (05/12 0531) Resp:  [20] 20 (05/12 0531) BP: (125-153)/(57-66) 141/66 (05/12 0531) SpO2:  [96 %-100 %] 98 % (05/12 0531) Intake/Output      05/11 0701 - 05/12 0700 05/12 0701 - 05/13 0700   P.O. 236    Total Intake(mL/kg) 236 (3.5)    Net +236          Urine Occurrence 3 x    Stool Occurrence 2 x     Awake and alert Follows commands throughout Full strength  Stable Continue current care

## 2016-09-28 NOTE — Progress Notes (Signed)
Pt had unremarkable night during shift; ambulated in hallways x1; denied any pain, slept comfortably in bed with call light within reach and spouse at bedside. Will report off to oncoming RN. Delia Heady RN

## 2016-09-28 NOTE — Progress Notes (Signed)
Pt seen and examined.  No issues overnight. Feels well this morning. Denies any concerns.  EXAM: Temp:  [97.9 F (36.6 C)-98.4 F (36.9 C)] 97.9 F (36.6 C) (05/13 0513) Pulse Rate:  [68-78] 78 (05/13 0513) Resp:  [17-18] 18 (05/13 0513) BP: (130-146)/(58-67) 146/63 (05/13 0513) SpO2:  [95 %-100 %] 100 % (05/13 0513) Intake/Output      05/12 0701 - 05/13 0700 05/13 0701 - 05/14 0700   P.O.     Total Intake(mL/kg)     Net            Urine Occurrence 1 x    Stool Occurrence 1 x     Awake and alert Follows commands throughout Full strength  Stable. Continue current care. NPO after midnight

## 2016-09-28 NOTE — Progress Notes (Signed)
Patient requests to see Dr.Nundkumar in am before signing his consent for surgery; wife requests the same.

## 2016-09-29 ENCOUNTER — Encounter (HOSPITAL_COMMUNITY): Payer: Self-pay | Admitting: Anesthesiology

## 2016-09-29 ENCOUNTER — Inpatient Hospital Stay (HOSPITAL_COMMUNITY): Payer: Medicare Other | Admitting: Anesthesiology

## 2016-09-29 ENCOUNTER — Encounter (HOSPITAL_COMMUNITY)
Admission: AD | Disposition: A | Discharge status: Hospice | Payer: Self-pay | Source: Ambulatory Visit | Attending: Neurosurgery

## 2016-09-29 ENCOUNTER — Inpatient Hospital Stay (HOSPITAL_COMMUNITY): Payer: Medicare Other

## 2016-09-29 HISTORY — PX: APPLICATION OF CRANIAL NAVIGATION: SHX6578

## 2016-09-29 HISTORY — PX: CRANIOTOMY: SHX93

## 2016-09-29 LAB — TYPE AND SCREEN
ABO/RH(D): A POS
ANTIBODY SCREEN: NEGATIVE

## 2016-09-29 LAB — SODIUM: Sodium: 134 mmol/L — ABNORMAL LOW (ref 135–145)

## 2016-09-29 LAB — ABO/RH: ABO/RH(D): A POS

## 2016-09-29 SURGERY — CRANIOTOMY TUMOR EXCISION
Anesthesia: General

## 2016-09-29 MED ORDER — PROPOFOL 10 MG/ML IV BOLUS
INTRAVENOUS | Status: AC
  Filled 2016-09-29: qty 40

## 2016-09-29 MED ORDER — LIDOCAINE 2% (20 MG/ML) 5 ML SYRINGE
INTRAMUSCULAR | Status: AC
  Filled 2016-09-29: qty 5

## 2016-09-29 MED ORDER — BUPIVACAINE HCL (PF) 0.5 % IJ SOLN
INTRAMUSCULAR | Status: AC
  Filled 2016-09-29: qty 30

## 2016-09-29 MED ORDER — 0.9 % SODIUM CHLORIDE (POUR BTL) OPTIME
TOPICAL | Status: DC | PRN
  Administered 2016-09-29 (×2): 1000 mL

## 2016-09-29 MED ORDER — THROMBIN 5000 UNITS EX SOLR
CUTANEOUS | Status: AC
  Filled 2016-09-29: qty 5000

## 2016-09-29 MED ORDER — LABETALOL HCL 5 MG/ML IV SOLN
INTRAVENOUS | Status: AC
Start: 2016-09-29 — End: 2016-09-30
  Filled 2016-09-29: qty 4

## 2016-09-29 MED ORDER — SODIUM CHLORIDE 0.9 % IV SOLN
0.0500 ug/kg/min | Freq: Once | INTRAVENOUS | Status: AC
  Administered 2016-09-29: .07 ug/kg/min via INTRAVENOUS
  Filled 2016-09-29 (×2): qty 5000

## 2016-09-29 MED ORDER — CEFAZOLIN SODIUM-DEXTROSE 2-4 GM/100ML-% IV SOLN
2.0000 g | INTRAVENOUS | Status: AC
  Administered 2016-09-29: 2 g via INTRAVENOUS
  Filled 2016-09-29 (×2): qty 100

## 2016-09-29 MED ORDER — PHENYLEPHRINE HCL 10 MG/ML IJ SOLN
INTRAVENOUS | Status: DC | PRN
  Administered 2016-09-29: 50 ug/min via INTRAVENOUS

## 2016-09-29 MED ORDER — LABETALOL HCL 5 MG/ML IV SOLN
INTRAVENOUS | Status: AC
  Filled 2016-09-29: qty 4

## 2016-09-29 MED ORDER — LIDOCAINE-EPINEPHRINE 1 %-1:100000 IJ SOLN
INTRAMUSCULAR | Status: DC | PRN
  Administered 2016-09-29: 10 mL

## 2016-09-29 MED ORDER — SODIUM CHLORIDE 0.9 % IV SOLN
INTRAVENOUS | Status: DC | PRN
  Administered 2016-09-29 (×2): via INTRAVENOUS

## 2016-09-29 MED ORDER — SODIUM CHLORIDE 3 % IV SOLN
INTRAVENOUS | Status: DC
Start: 2016-09-29 — End: 2016-09-30
  Administered 2016-09-29 – 2016-09-30 (×2): 75 mL/h via INTRAVENOUS
  Filled 2016-09-29 (×4): qty 500

## 2016-09-29 MED ORDER — SODIUM CHLORIDE 0.9 % IV SOLN
INTRAVENOUS | Status: DC | PRN
  Administered 2016-09-29: 08:00:00 via INTRAVENOUS

## 2016-09-29 MED ORDER — LABETALOL HCL 5 MG/ML IV SOLN
10.0000 mg | Freq: Once | INTRAVENOUS | Status: AC
  Administered 2016-09-29: 10 mg via INTRAVENOUS

## 2016-09-29 MED ORDER — MANNITOL 25 % IV SOLN
50.0000 g | Freq: Once | INTRAVENOUS | Status: AC
  Administered 2016-09-29: 25 g via INTRAVENOUS
  Filled 2016-09-29: qty 200

## 2016-09-29 MED ORDER — LACTATED RINGERS IV SOLN
INTRAVENOUS | Status: DC | PRN
  Administered 2016-09-29: 07:00:00 via INTRAVENOUS

## 2016-09-29 MED ORDER — BACITRACIN ZINC 500 UNIT/GM EX OINT
TOPICAL_OINTMENT | CUTANEOUS | Status: AC
  Filled 2016-09-29: qty 28.35

## 2016-09-29 MED ORDER — FENTANYL CITRATE (PF) 100 MCG/2ML IJ SOLN
INTRAMUSCULAR | Status: DC | PRN
  Administered 2016-09-29 (×2): 100 ug via INTRAVENOUS
  Administered 2016-09-29: 50 ug via INTRAVENOUS

## 2016-09-29 MED ORDER — SODIUM CHLORIDE 0.9 % IV SOLN
INTRAVENOUS | Status: DC
  Administered 2016-09-29: 14:00:00 via INTRAVENOUS

## 2016-09-29 MED ORDER — PROPOFOL 10 MG/ML IV BOLUS
INTRAVENOUS | Status: DC | PRN
  Administered 2016-09-29: 130 mg via INTRAVENOUS

## 2016-09-29 MED ORDER — THROMBIN 20000 UNITS EX SOLR
CUTANEOUS | Status: AC
  Filled 2016-09-29: qty 20000

## 2016-09-29 MED ORDER — BUPIVACAINE HCL (PF) 0.5 % IJ SOLN
INTRAMUSCULAR | Status: DC | PRN
  Administered 2016-09-29: 10 mL

## 2016-09-29 MED ORDER — ROCURONIUM BROMIDE 100 MG/10ML IV SOLN
INTRAVENOUS | Status: DC | PRN
  Administered 2016-09-29 (×3): 20 mg via INTRAVENOUS
  Administered 2016-09-29 (×2): 50 mg via INTRAVENOUS
  Administered 2016-09-29: 20 mg via INTRAVENOUS
  Administered 2016-09-29: 30 mg via INTRAVENOUS

## 2016-09-29 MED ORDER — EPHEDRINE 5 MG/ML INJ
INTRAVENOUS | Status: AC
  Filled 2016-09-29: qty 20

## 2016-09-29 MED ORDER — SUGAMMADEX SODIUM 200 MG/2ML IV SOLN
INTRAVENOUS | Status: AC
  Filled 2016-09-29: qty 2

## 2016-09-29 MED ORDER — BACITRACIN ZINC 500 UNIT/GM EX OINT
TOPICAL_OINTMENT | CUTANEOUS | Status: DC | PRN
  Administered 2016-09-29: 1 via TOPICAL

## 2016-09-29 MED ORDER — LIDOCAINE-EPINEPHRINE 1 %-1:100000 IJ SOLN
INTRAMUSCULAR | Status: AC
  Filled 2016-09-29: qty 1

## 2016-09-29 MED ORDER — SUCCINYLCHOLINE CHLORIDE 200 MG/10ML IV SOSY
PREFILLED_SYRINGE | INTRAVENOUS | Status: AC
  Filled 2016-09-29: qty 10

## 2016-09-29 MED ORDER — ARTIFICIAL TEARS OPHTHALMIC OINT
TOPICAL_OINTMENT | OPHTHALMIC | Status: AC
  Filled 2016-09-29: qty 7

## 2016-09-29 MED ORDER — LIDOCAINE 2% (20 MG/ML) 5 ML SYRINGE
INTRAMUSCULAR | Status: DC | PRN
  Administered 2016-09-29: 60 mg via INTRAVENOUS

## 2016-09-29 MED ORDER — PROMETHAZINE HCL 25 MG PO TABS: 12.5000 mg | ORAL_TABLET | ORAL | Status: DC | PRN

## 2016-09-29 MED ORDER — ROCURONIUM BROMIDE 10 MG/ML (PF) SYRINGE
PREFILLED_SYRINGE | INTRAVENOUS | Status: AC
  Filled 2016-09-29: qty 5

## 2016-09-29 MED ORDER — ONDANSETRON HCL 4 MG/2ML IJ SOLN
INTRAMUSCULAR | Status: DC | PRN
  Administered 2016-09-29: 4 mg via INTRAVENOUS

## 2016-09-29 MED ORDER — SUGAMMADEX SODIUM 200 MG/2ML IV SOLN
INTRAVENOUS | Status: DC | PRN
  Administered 2016-09-29: 140 mg via INTRAVENOUS

## 2016-09-29 MED ORDER — HEMOSTATIC AGENTS (NO CHARGE) OPTIME
TOPICAL | Status: DC | PRN
  Administered 2016-09-29: 1 via TOPICAL

## 2016-09-29 MED ORDER — DEXAMETHASONE SODIUM PHOSPHATE 10 MG/ML IJ SOLN
INTRAMUSCULAR | Status: AC
  Filled 2016-09-29: qty 1

## 2016-09-29 MED ORDER — CEFAZOLIN SODIUM-DEXTROSE 1-4 GM/50ML-% IV SOLN
1.0000 g | Freq: Three times a day (TID) | INTRAVENOUS | Status: AC
  Administered 2016-09-29: 1 g via INTRAVENOUS
  Filled 2016-09-29 (×2): qty 50

## 2016-09-29 MED ORDER — ONDANSETRON HCL 4 MG/2ML IJ SOLN
INTRAMUSCULAR | Status: AC
  Filled 2016-09-29: qty 2

## 2016-09-29 MED ORDER — PHENYLEPHRINE 40 MCG/ML (10ML) SYRINGE FOR IV PUSH (FOR BLOOD PRESSURE SUPPORT)
PREFILLED_SYRINGE | INTRAVENOUS | Status: DC | PRN
Start: 2016-09-29 — End: 2016-09-29
  Administered 2016-09-29 (×2): 80 ug via INTRAVENOUS

## 2016-09-29 MED ORDER — FENTANYL CITRATE (PF) 100 MCG/2ML IJ SOLN
INTRAMUSCULAR | Status: AC
  Filled 2016-09-29: qty 2

## 2016-09-29 MED ORDER — NICARDIPINE HCL IN NACL 20-0.86 MG/200ML-% IV SOLN
3.0000 mg/h | INTRAVENOUS | Status: DC
  Administered 2016-09-29: 5 mg/h via INTRAVENOUS
  Administered 2016-09-30: 3 mg/h via INTRAVENOUS
  Filled 2016-09-29 (×2): qty 200

## 2016-09-29 MED ORDER — SURGIFOAM 100 EX MISC
CUTANEOUS | Status: DC | PRN
  Administered 2016-09-29: 08:00:00 via TOPICAL

## 2016-09-29 MED ORDER — ARTIFICIAL TEARS OPHTHALMIC OINT
TOPICAL_OINTMENT | OPHTHALMIC | Status: DC | PRN
  Administered 2016-09-29: 1 via OPHTHALMIC

## 2016-09-29 MED ORDER — LABETALOL HCL 5 MG/ML IV SOLN
INTRAVENOUS | Status: DC | PRN
  Administered 2016-09-29 (×2): 10 mg via INTRAVENOUS

## 2016-09-29 MED ORDER — PANTOPRAZOLE SODIUM 40 MG IV SOLR
40.0000 mg | Freq: Every day | INTRAVENOUS | Status: DC
  Administered 2016-09-29: 40 mg via INTRAVENOUS
  Filled 2016-09-29: qty 40

## 2016-09-29 MED ORDER — FENTANYL CITRATE (PF) 250 MCG/5ML IJ SOLN
INTRAMUSCULAR | Status: AC
  Filled 2016-09-29: qty 5

## 2016-09-29 MED ORDER — THROMBIN 5000 UNITS EX SOLR
OROMUCOSAL | Status: DC | PRN
  Administered 2016-09-29: 08:00:00 via TOPICAL
  Administered 2016-09-29: 5 mL via TOPICAL

## 2016-09-29 SURGICAL SUPPLY — 108 items
BANDAGE GAUZE 4  KLING STR (GAUZE/BANDAGES/DRESSINGS) ×3 IMPLANT
BENZOIN TINCTURE PRP APPL 2/3 (GAUZE/BANDAGES/DRESSINGS) IMPLANT
BLADE CLIPPER SURG (BLADE) ×3 IMPLANT
BLADE SAW GIGLI 16 STRL (MISCELLANEOUS) IMPLANT
BLADE SURG 15 STRL LF DISP TIS (BLADE) IMPLANT
BLADE SURG 15 STRL SS (BLADE)
BLADE ULTRA TIP 2M (BLADE) ×3 IMPLANT
BNDG GAUZE ELAST 4 BULKY (GAUZE/BANDAGES/DRESSINGS) ×3 IMPLANT
BUR ACORN 6.0 PRECISION (BURR) ×2 IMPLANT
BUR ACORN 6.0MM PRECISION (BURR) ×1
BUR ROUND FLUTED 4 SOFT TCH (BURR) ×2 IMPLANT
BUR ROUND FLUTED 4MM SOFT TCH (BURR) ×1
BUR SPIRAL ROUTER 2.3 (BUR) ×2 IMPLANT
BUR SPIRAL ROUTER 2.3MM (BUR) ×1
CANISTER SUCT 3000ML PPV (MISCELLANEOUS) ×6 IMPLANT
CARTRIDGE OIL MAESTRO DRILL (MISCELLANEOUS) ×1 IMPLANT
CATH VENTRIC 35X38 W/TROCAR LG (CATHETERS) IMPLANT
CLIP TI MEDIUM 6 (CLIP) IMPLANT
CONT SPEC 4OZ CLIKSEAL STRL BL (MISCELLANEOUS) ×3 IMPLANT
COVER MAYO STAND STRL (DRAPES) IMPLANT
DECANTER SPIKE VIAL GLASS SM (MISCELLANEOUS) ×3 IMPLANT
DERMABOND ADVANCED (GAUZE/BANDAGES/DRESSINGS) ×2
DERMABOND ADVANCED .7 DNX12 (GAUZE/BANDAGES/DRESSINGS) ×1 IMPLANT
DIFFUSER DRILL AIR PNEUMATIC (MISCELLANEOUS) ×6 IMPLANT
DRAIN SUBARACHNOID (WOUND CARE) IMPLANT
DRAPE HALF SHEET 40X57 (DRAPES) ×3 IMPLANT
DRAPE MICROSCOPE LEICA (MISCELLANEOUS) ×6 IMPLANT
DRAPE NEUROLOGICAL W/INCISE (DRAPES) ×3 IMPLANT
DRAPE STERI IOBAN 125X83 (DRAPES) IMPLANT
DRAPE SURG 17X23 STRL (DRAPES) IMPLANT
DRAPE WARM FLUID 44X44 (DRAPE) ×3 IMPLANT
DRSG ADAPTIC 3X8 NADH LF (GAUZE/BANDAGES/DRESSINGS) IMPLANT
DRSG OPSITE POSTOP 4X6 (GAUZE/BANDAGES/DRESSINGS) ×3 IMPLANT
DRSG TELFA 3X8 NADH (GAUZE/BANDAGES/DRESSINGS) IMPLANT
DURAMATRIX ONLAY 2X2 (Neuro Prosthesis/Implant) ×6 IMPLANT
DURAPREP 6ML APPLICATOR 50/CS (WOUND CARE) ×3 IMPLANT
ELECT REM PT RETURN 9FT ADLT (ELECTROSURGICAL) ×3
ELECTRODE REM PT RTRN 9FT ADLT (ELECTROSURGICAL) ×1 IMPLANT
EVACUATOR 1/8 PVC DRAIN (DRAIN) IMPLANT
EVACUATOR SILICONE 100CC (DRAIN) IMPLANT
FORCEPS BIPOLAR SPETZLER 8 1.0 (NEUROSURGERY SUPPLIES) ×3 IMPLANT
GAUZE SPONGE 4X4 12PLY STRL (GAUZE/BANDAGES/DRESSINGS) ×3 IMPLANT
GAUZE SPONGE 4X4 16PLY XRAY LF (GAUZE/BANDAGES/DRESSINGS) IMPLANT
GLOVE BIO SURGEON STRL SZ7 (GLOVE) ×6 IMPLANT
GLOVE BIO SURGEON STRL SZ8 (GLOVE) ×3 IMPLANT
GLOVE BIO SURGEON STRL SZ8.5 (GLOVE) ×3 IMPLANT
GLOVE BIOGEL PI IND STRL 7.0 (GLOVE) IMPLANT
GLOVE BIOGEL PI IND STRL 7.5 (GLOVE) ×1 IMPLANT
GLOVE BIOGEL PI INDICATOR 7.0 (GLOVE)
GLOVE BIOGEL PI INDICATOR 7.5 (GLOVE) ×2
GLOVE ECLIPSE 7.0 STRL STRAW (GLOVE) ×6 IMPLANT
GLOVE EXAM NITRILE LRG STRL (GLOVE) IMPLANT
GLOVE EXAM NITRILE XL STR (GLOVE) IMPLANT
GLOVE EXAM NITRILE XS STR PU (GLOVE) IMPLANT
GLOVE SURG SS PI 7.0 STRL IVOR (GLOVE) ×9 IMPLANT
GOWN STRL REUS W/ TWL LRG LVL3 (GOWN DISPOSABLE) ×2 IMPLANT
GOWN STRL REUS W/ TWL XL LVL3 (GOWN DISPOSABLE) ×1 IMPLANT
GOWN STRL REUS W/TWL 2XL LVL3 (GOWN DISPOSABLE) IMPLANT
GOWN STRL REUS W/TWL LRG LVL3 (GOWN DISPOSABLE) ×4
GOWN STRL REUS W/TWL XL LVL3 (GOWN DISPOSABLE) ×2
HEMOSTAT POWDER KIT SURGIFOAM (HEMOSTASIS) ×6 IMPLANT
HEMOSTAT POWDER SURGIFOAM 1G (HEMOSTASIS) ×6 IMPLANT
HEMOSTAT SURGICEL 2X14 (HEMOSTASIS) ×3 IMPLANT
HOOK DURA 1/2IN (MISCELLANEOUS) ×6 IMPLANT
IV NS 1000ML (IV SOLUTION) ×2
IV NS 1000ML BAXH (IV SOLUTION) ×1 IMPLANT
KIT BASIN OR (CUSTOM PROCEDURE TRAY) ×3 IMPLANT
KIT DRAIN CSF ACCUDRAIN (MISCELLANEOUS) IMPLANT
KIT ROOM TURNOVER OR (KITS) ×3 IMPLANT
KNIFE ARACHNOID DISP AM-24-S (MISCELLANEOUS) IMPLANT
MARKER SKIN DUAL TIP RULER LAB (MISCELLANEOUS) ×3 IMPLANT
MARKER SPHERE PSV REFLC 13MM (MARKER) ×15 IMPLANT
NEEDLE HYPO 22GX1.5 SAFETY (NEEDLE) ×3 IMPLANT
NEEDLE SPNL 18GX3.5 QUINCKE PK (NEEDLE) IMPLANT
NS IRRIG 1000ML POUR BTL (IV SOLUTION) ×9 IMPLANT
OIL CARTRIDGE MAESTRO DRILL (MISCELLANEOUS) ×3
PACK CRANIOTOMY (CUSTOM PROCEDURE TRAY) ×3 IMPLANT
PATTIES SURGICAL .25X.25 (GAUZE/BANDAGES/DRESSINGS) IMPLANT
PATTIES SURGICAL .5 X.5 (GAUZE/BANDAGES/DRESSINGS) ×3 IMPLANT
PATTIES SURGICAL .5 X3 (DISPOSABLE) ×3 IMPLANT
PATTIES SURGICAL 1/4 X 3 (GAUZE/BANDAGES/DRESSINGS) IMPLANT
PATTIES SURGICAL 1X1 (DISPOSABLE) ×3 IMPLANT
PIN MAYFIELD SKULL DISP (PIN) ×3 IMPLANT
RUBBERBAND STERILE (MISCELLANEOUS) ×6 IMPLANT
SEALANT ADHERUS EXTEND TIP (MISCELLANEOUS) ×2 IMPLANT
SET TUBING W/EXT DISP (INSTRUMENTS) ×3 IMPLANT
SPECIMEN JAR SMALL (MISCELLANEOUS) IMPLANT
SPONGE NEURO XRAY DETECT 1X3 (DISPOSABLE) IMPLANT
SPONGE SURGIFOAM ABS GEL 100 (HEMOSTASIS) ×3 IMPLANT
STAPLER VISISTAT 35W (STAPLE) ×3 IMPLANT
STOCKINETTE 6  STRL (DRAPES)
STOCKINETTE 6 STRL (DRAPES) IMPLANT
SUT ETHILON 3 0 FSL (SUTURE) IMPLANT
SUT ETHILON 3 0 PS 1 (SUTURE) IMPLANT
SUT NURALON 4 0 TR CR/8 (SUTURE) ×9 IMPLANT
SUT SILK 0 TIES 10X30 (SUTURE) IMPLANT
SUT VIC AB 0 CT1 18XCR BRD8 (SUTURE) ×4 IMPLANT
SUT VIC AB 0 CT1 8-18 (SUTURE) ×8
SUT VIC AB 3-0 SH 8-18 (SUTURE) ×3 IMPLANT
SUT VICRYL 3-0 RB1 18 ABS (SUTURE) ×9 IMPLANT
TIP STRAIGHT 25KHZ (INSTRUMENTS) ×3 IMPLANT
TOWEL GREEN STERILE (TOWEL DISPOSABLE) ×3 IMPLANT
TOWEL GREEN STERILE FF (TOWEL DISPOSABLE) ×2 IMPLANT
TRAY FOLEY W/METER SILVER 16FR (SET/KITS/TRAYS/PACK) ×3 IMPLANT
TUBE CONNECTING 12'X1/4 (SUCTIONS) ×1
TUBE CONNECTING 12X1/4 (SUCTIONS) ×2 IMPLANT
UNDERPAD 30X30 (UNDERPADS AND DIAPERS) ×3 IMPLANT
WATER STERILE IRR 1000ML POUR (IV SOLUTION) ×3 IMPLANT

## 2016-09-29 NOTE — Transfer of Care (Signed)
Immediate Anesthesia Transfer of Care Note  Patient: Zachary Mccarthy  Procedure(s) Performed: Procedure(s): Suboccipital craniotomy for resection of tumor with Brainlab (N/A) APPLICATION OF CRANIAL NAVIGATION (N/A)  Patient Location: PACU  Anesthesia Type:General  Level of Consciousness: awake and alert   Airway & Oxygen Therapy: Patient Spontanous Breathing and Patient connected to nasal cannula oxygen  Post-op Assessment: Report given to RN, Post -op Vital signs reviewed and stable and Patient moving all extremities  Post vital signs: Reviewed and stable  Last Vitals:  Vitals:   09/28/16 2312 09/29/16 0514  BP: 135/60 (!) 154/60  Pulse: 72 79  Resp: 16 18  Temp: 36.9 C 36.8 C    Last Pain:  Vitals:   09/29/16 0514  TempSrc: Oral  PainSc:          Complications: No apparent anesthesia complications

## 2016-09-29 NOTE — Progress Notes (Signed)
Contacted neurosurgeon on call regarding patient's mental status. Notified Dr. Arnoldo Morale that patient is more lethargic and not talking at this time. No further orders per MD at this time. MD aware palliative is to come speak with family tomorrow and that we are doing no surgical intervention per family decision. MRI canceled as directed by MD. Will continue to monitor patient.

## 2016-09-29 NOTE — Progress Notes (Signed)
Pt seen in ICU. Somewhat drowsy, but opens eyes, follows simple commands R>L.  Will get CTH to r/o acute worsening of HCP. Spoke with the family, and updated plan. I explained to them that with his current exam, I don't think that repeat operation should there be a hematoma would be tolerated well. If there is HCP we could consider placement of an EVD.

## 2016-09-29 NOTE — Anesthesia Procedure Notes (Signed)
Procedure Name: Intubation Date/Time: 09/29/2016 7:54 AM Performed by: Trixie Deis A Pre-anesthesia Checklist: Patient identified, Emergency Drugs available, Suction available and Patient being monitored Patient Re-evaluated:Patient Re-evaluated prior to inductionOxygen Delivery Method: Circle System Utilized Preoxygenation: Pre-oxygenation with 100% oxygen Intubation Type: IV induction Ventilation: Mask ventilation without difficulty Laryngoscope Size: Mac and 4 Grade View: Grade I Tube type: Subglottic suction tube Tube size: 7.5 mm Number of attempts: 1 Airway Equipment and Method: Stylet and Oral airway Placement Confirmation: ETT inserted through vocal cords under direct vision,  positive ETCO2 and breath sounds checked- equal and bilateral Secured at: 23 cm Tube secured with: Tape Dental Injury: Teeth and Oropharynx as per pre-operative assessment

## 2016-09-29 NOTE — Anesthesia Postprocedure Evaluation (Signed)
Anesthesia Post Note  Patient: Loxley Schmale  Procedure(s) Performed: Procedure(s) (LRB): Suboccipital craniotomy for resection of tumor with Brainlab (N/A) APPLICATION OF CRANIAL NAVIGATION (N/A)  Patient location during evaluation: PACU Anesthesia Type: General Level of consciousness: awake and alert and confused Pain management: pain level controlled Vital Signs Assessment: post-procedure vital signs reviewed and stable Respiratory status: spontaneous breathing, nonlabored ventilation, respiratory function stable and patient connected to nasal cannula oxygen Cardiovascular status: blood pressure returned to baseline and stable Postop Assessment: no signs of nausea or vomiting Anesthetic complications: no       Last Vitals:  Vitals:   09/29/16 1255 09/29/16 1300  BP: (!) 171/90 (!) 183/93  Pulse: 64 61  Resp: 14 (!) 8  Temp:  36.2 C    Last Pain:  Vitals:   09/29/16 1224  TempSrc:   PainSc: 0-No pain                 Keenon Leitzel A.

## 2016-09-29 NOTE — Progress Notes (Addendum)
Pt remains drowsy, but arousable. Eyes open Follows simple commands, L>R Wound c/d/I  CTH reviewed, demonstrates hematoma in the resection cavity with surrounding edema. There is progressive effacement of the 4th ventricle. Subtle increase in ventriculomegaly.  IMPRESSION: 76 y.o. man with stage IV metastatic lung CA. S/P resection of large right cerebellar tumor with postoperative hematoma. His exam is marginal at this point, and I am certainly concerned that surgical evacuation will result in another hematoma. Also, I suspect that the subtle increase in ventriculomegaly is likely only partially the cause of his poor condition currently.   PLAN: I have discussed at length with the patient's family including wife, daughter, sister, and niece. I reviewed the three options at this point which would be to proceed with surgical evacuation, place and EVD, or only treat medically with a focus on palliation rather than aggressive treatment. Likelihood of good/bad outcome with each option was discussed thoroughly, keeping in mind the underlying uncurable and likely untreatable stage IV metastatic lung CA. Ultimately, the patient's wife and daughter decided not to pursue surgery or EVD and to treat medically with a focus on palliative care. We did also review code status in light of this decision and agreed to change status to DNR/DNI. All their questions were answered.  - start cardene for SBP management - start hypertonic saline - Will consult palliative care service

## 2016-09-29 NOTE — Clinical Social Work Note (Signed)
CSW continuing to follow. When pt closer to medical clearance CSW will contact [facility] to determine if they can accomodate pt's needs.  [place], [place]

## 2016-09-29 NOTE — Anesthesia Preprocedure Evaluation (Signed)
Anesthesia Evaluation  Patient identified by MRN, date of birth, ID band Patient awake and Patient confused    Reviewed: Allergy & Precautions, NPO status , Patient's Chart, lab work & pertinent test results, reviewed documented beta blocker date and time   Airway Mallampati: II  TM Distance: >3 FB Neck ROM: Full    Dental  (+) Edentulous Upper, Edentulous Lower   Pulmonary former smoker,  Lung Ca with mets to brain and bone   Pulmonary exam normal breath sounds clear to auscultation       Cardiovascular hypertension, Pt. on medications and Pt. on home beta blockers Normal cardiovascular exam Rhythm:Regular Rate:Normal     Neuro/Psych PSYCHIATRIC DISORDERS Metastatic brain tumor    GI/Hepatic negative GI ROS, Neg liver ROS,   Endo/Other  Hyperlipidemia  Renal/GU Renal InsufficiencyRenal disease   Bladder mass    Musculoskeletal Compression Fx spine   Abdominal   Peds  Hematology  (+) anemia , Thrombocytopenia   Anesthesia Other Findings   Reproductive/Obstetrics                             Anesthesia Physical Anesthesia Plan  ASA: III  Anesthesia Plan: General   Post-op Pain Management:    Induction: Intravenous  Airway Management Planned: Oral ETT  Additional Equipment: Arterial line  Intra-op Plan:   Post-operative Plan: Extubation in OR  Informed Consent: I have reviewed the patients History and Physical, chart, labs and discussed the procedure including the risks, benefits and alternatives for the proposed anesthesia with the patient or authorized representative who has indicated his/her understanding and acceptance.   Dental advisory given  Plan Discussed with: Anesthesiologist, CRNA and Surgeon  Anesthesia Plan Comments: (Remifentanil )        Anesthesia Quick Evaluation

## 2016-09-29 NOTE — Op Note (Signed)
PREOP DIAGNOSIS:  1. Hydrocephalus 2. metastatic non-small cell lung cancer   POSTOP DIAGNOSIS: Same  PROCEDURE: 1. Stereotactic suboccipital craniectomy for resection of intra-axial metastatic lesion 2. Use of intraoperative microscope for microdissection  SURGEON: Dr. [name], MD  ASSISTANT: Dr. Newman Pies, M.D.  ANESTHESIA: General Endotracheal  EBL: 300cc  SPECIMENS: Cerebellar tumor for permanent pathology  DRAINS: None  COMPLICATIONS: None immediate  CONDITION: Hemodynamically stable to PACU  HISTORY: Zachary Mccarthy is a 76 y.o. male admitted to the hospital initially with severe right-sided leg pain related to fracture at L4, likely pathologic. He initially underwent decompression at L4-L5 on the right, with transpedicular biopsy which revealed non-small cell lung cancer. Further workup included CT scan of the chest which demonstrated a large hilar mass, as well as multiple intracranial metastases including a large proximally for several medial lesion in the right cerebellum with obstructive hydrocephalus. With these findings, surgical resection was indicated. The patient underwent preoperative stereotactic radiosurgery to all intracranial metastases 3 days ago, and presents today to the operating room for resection of the cerebellar lesion. The risks and benefits of the surgery were explained in detail to the patient and his wife. After all questions were answered informed consent was obtained and witnessed.  PROCEDURE IN DETAIL: The patient was brought to the operating room. After induction of general anesthesia, the patient was positioned on the operative table in the [place] headholder in the prone position. All pressure points were meticulously padded. Utilizing the preoperative stereotactic MRI scan, surface markers were co-registered until satisfactory accuracy was achieved. A sigmoid shaped skin incision was then marked out to the right of midline to allow  access to the tumor, marking out the margin of the transverse sinus.   After timeout was conducted, incision was infiltrated with local anesthetic with epinephrine. Incision was then made sharply, and electro cautery is used to dissect the subcutaneous tissue until the nuchal fascia was identified. A Y-shaped fascial incision was made based superiorly. Suboccipital musculature was then divided, and elevated in the subperiosteal plane. Self retaining retractors were then placed. The stereotactic system was used to identify the underlying transverse sinus. Exposure was carried down until the lateral edge of the foramen magnum was identified. High-speed drill was then used to create a craniectomy down to the level of the foramen magnum. Craniectomy was extended superiorly until the margin of the transverse sinus was identified.  At this point, the intraoperative microscope was draped sterilely and brought into the field, and the remainder of the case was done under the microscope using microdissection. The dura was opened in stellate fashion, and cerebellum was immediately herniating through the dural defect. Bipolar electrocautery, suction, and ultrasonic aspirator was then used to easily dissect the tumor away from surrounding cerebellar cortex and white matter. Margin of the tumor was developed in all planes, and was removed essentially en bloc. Margins of the resection cavity were then inspected, and further tumor was removed from the lateral margin. Specimen was then sent for permanent pathology.  At this point, the wound was irrigated with normal saline irrigation. Hemostasis in the resection cavity was achieved with a combination of bipolar electrocautery and morcellized Gelfoam with thrombin. A dura matrix collagen inlay graft was then placed over the cerebellum. Similarly, a dura matrix onlay graft was placed. This was then covered with Adherus polyethylene glycol. Wound was again irrigated with  antibiotic saline. The wound was then closed in multiple layers using accommodation of interrupted 0 and 3-0 Vicryl stitches.  A layer of Dermabond was applied, and after it was dried sterile dressing was placed.  Patient was then transferred to the stretcher, the [place] head holder was removed, and he was taken to the post anesthesia care unit in stable hemodynamic condition. At the end of the case all sponge, needle, instrument, and cottonoid counts were correct.

## 2016-09-29 NOTE — Progress Notes (Signed)
Pt still unable to cross midline to the right. Is slightly more awake then when first arrived. Still following only minimal commands. The grip in left hand was strongest it has been. Notified MD once more an there are no new orders at this time.

## 2016-09-29 NOTE — Progress Notes (Signed)
No issues overnight. Pt has no new complaints.  EXAM:  BP (!) 154/60 (BP Location: Left Arm)   Pulse 79   Temp 98.2 F (36.8 C) (Oral)   Resp 18   Ht '5\' 9"'$  (1.753 m)   Wt 147 lb 4.8 oz (66.8 kg)   SpO2 100%   BMI 21.75 kg/m   Awake, alert Speech fluent, appropriate  CN grossly intact  5/5 BUE/BLE   IMPRESSION:  76 y.o. male with metastatic lung CA, had preop SRS to 9 brain lesions on Friday including large right cerebellar lesion with HCP  PLAN: - Proceed with suboccipital crani for resection  I have reviewed the procedure with the patient and his wife. All questions were answered, they are ready to proceed.

## 2016-09-29 NOTE — Progress Notes (Signed)
Pt arrived to me from PACU BP 183/72 cuff. Pt would respond to voice and answer only name. Speech slightly slurred. Pt difficult to keep awake to assess. Pt was unable to track eyes to the right. He wasn't crossing midline to the right. Notified MD who is in surgery at the moment. No new orders at this time. MD will assess once finished the case.

## 2016-09-30 ENCOUNTER — Encounter (HOSPITAL_COMMUNITY): Payer: Self-pay | Admitting: Neurosurgery

## 2016-09-30 DIAGNOSIS — Z9889 Other specified postprocedural states: Secondary | ICD-10-CM

## 2016-09-30 LAB — SODIUM: Sodium: 139 mmol/L (ref 135–145)

## 2016-09-30 MED ORDER — GLYCOPYRROLATE 0.2 MG/ML IJ SOLN
0.4000 mg | Freq: Four times a day (QID) | INTRAMUSCULAR | Status: DC
  Administered 2016-09-30 – 2016-10-02 (×10): 0.4 mg via INTRAVENOUS
  Filled 2016-09-30 (×11): qty 2

## 2016-09-30 MED ORDER — LORAZEPAM 2 MG/ML IJ SOLN
1.0000 mg | INTRAMUSCULAR | Status: DC | PRN
  Administered 2016-09-30: 1 mg via INTRAVENOUS
  Filled 2016-09-30: qty 1

## 2016-09-30 MED ORDER — MORPHINE SULFATE (PF) 4 MG/ML IV SOLN
2.0000 mg | INTRAVENOUS | Status: DC | PRN
  Administered 2016-09-30 – 2016-10-01 (×3): 2 mg via INTRAVENOUS
  Filled 2016-09-30 (×3): qty 1

## 2016-09-30 NOTE — Progress Notes (Signed)
PT Cancellation Note  Patient Details Name: Zachary Mccarthy MRN: [id number] DOB: 1940/07/04   Cancelled Treatment:    Reason Eval/Treat Not Completed: PT screened, no needs identified, will sign off.  PT and OT will sign off at this time due to move toward comfort care. 09/30/2016  Donnella Sham, [place] [phone]  (pager)   Zachary Mccarthy 09/30/2016, 11:24 AM

## 2016-09-30 NOTE — Progress Notes (Signed)
Pt seen by palliative care this am, transitioned to full comfort care. Spoke with pts wife and daughter. All questions answered.

## 2016-09-30 NOTE — Progress Notes (Signed)
Pt neuro status has declined even further. No cough/gag reflex at this time. Pt pupils are non reactive bilaterally. No corneal reflex on the right eye. Slight flicker to pain in rt toe. No movement to pain in the other extremities. Placed call at 0730 to NP with Palliative and awaiting for her to some speak with family. MD aware also of the decline and we will await palliative to speak with family before discontinuing treatments such as cardene gtt and 3%.

## 2016-09-30 NOTE — Progress Notes (Signed)
OT Cancellation and discharge  Note  Patient Details Name: Zachary Mccarthy MRN: [id number] DOB: July 01, 1940   Cancelled Treatment:    Reason Eval/Treat Not Completed: Medical issues which prohibited therapy.  Events noted.   OT will sign off at this time due to medical decline and moving toward comfort.    [place], OTR/L [phone]   Lucille Passy M 09/30/2016, 2:12 PM

## 2016-09-30 NOTE — Progress Notes (Signed)
Nutrition Brief Note  Chart reviewed. Pt with medical decline, palliative care to see patient.   Pt now NPO no oral nutrition interventions appropriate at this time.  Please re-consult as needed.   [place], [place], [place] Pager [phone] After Hours Pager

## 2016-09-30 NOTE — Progress Notes (Signed)
Daily Progress Note   Patient Name: Zachary Mccarthy       Date: 09/30/2016 DOB: [birth year]  Age: 76 y.o. MRN#: [id number] Attending Physician: [name], MD Primary Care Physician: [name], MD Admit Date: 09/15/2016  Reason for Consultation/Follow-up: Establishing goals of care and Psychosocial/spiritual support  Subjective:  -meet at bedside with [name].  She shared her sadness regarding the outcomes of her husband surgery.  We talked about his underlying condition of metastatic lung cancer.   - she understands his prognosis is hours to days and her hope is for comfort and dignity  - detailed  a palliative comfort care path for this patient at this time was had.    - discussed the Natural trajectory and expectations at EOL were discussed.  Questions and concerns addressed.   Family encouraged to call with questions or concerns.  PMT will continue to support holistically.  Length of Stay: 15  Current Medications: Scheduled Meds:  . dexamethasone  4 mg Intravenous Q6H  . feeding supplement (ENSURE ENLIVE)  237 mL Oral TID BM  . glycopyrrolate  0.4 mg Intravenous QID    Continuous Infusions: . sodium chloride Stopped (09/29/16 2115)  . methocarbamol (ROBAXIN)  IV    . sodium chloride (hypertonic) 75 mL/hr at 09/30/16 0700    PRN Meds: bisacodyl, LORazepam, menthol-cetylpyridinium **OR** phenol, methocarbamol **OR** methocarbamol (ROBAXIN)  IV, morphine injection, ondansetron **OR** ondansetron (ZOFRAN) IV, promethazine, sodium phosphate  Physical Exam  Constitutional: He appears cachectic. He appears ill.  Cardiovascular: Tachycardia present.   Pulmonary/Chest: He has decreased breath sounds in the right lower field and the left lower field.  Neurological: He  is unresponsive.  Skin: Skin is warm and dry.            Vital Signs: BP (!) 164/68   Pulse 88   Temp 97.6 F (36.4 C) (Axillary)   Resp 11   Ht '5\' 9"'$  (1.753 m)   Wt 70.3 kg (154 lb 15.7 oz)   SpO2 97%   BMI 22.89 kg/m  SpO2: SpO2: 97 % O2 Device: O2 Device: Nasal Cannula O2 Flow Rate: O2 Flow Rate (L/min): 2 L/min  Intake/output summary:  Intake/Output Summary (Last 24 hours) at 09/30/16 0857 Last data filed at 09/30/16 0800  Gross per 24 hour  Intake  2962.75 ml  Output             2240 ml  Net           722.75 ml   LBM: Last BM Date: 09/28/16 Baseline Weight: Weight: 63.2 kg (139 lb 6.4 oz) Most recent weight: Weight: 70.3 kg (154 lb 15.7 oz)       Palliative Assessment/Data:  10%    Flowsheet Rows     Most Recent Value  Intake Tab  Referral Department  Neurology  Unit at Time of Referral  Med/Surg Unit  Palliative Care Primary Diagnosis  Cancer  Date Notified  09/23/16  Palliative Care Type  New Palliative care  Reason for referral  Clarify Goals of Care  Date of Admission  09/15/16  Date first seen by Palliative Care  09/23/16  # of days Palliative referral response time  0 Day(s)  # of days IP prior to Palliative referral  8  Clinical Assessment  Palliative Performance Scale Score  50%  Psychosocial & Spiritual Assessment  Palliative Care Outcomes      Patient Active Problem List   Diagnosis Date Noted  . Palliative care by specialist   . 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 Loss of weight   . Brain metastasis ([place]) 09/19/2016  . Lung nodule   . Pathologic compression fracture of spine ([place]) 09/15/2016  . Essential hypertension 09/15/2016  . Hyperlipidemia 09/15/2016  . Chronic kidney disease 09/15/2016  . DNR (do not resuscitate) discussion   . Confusion   . Pathologic compression fracture of lumbar vertebra ([place]) 09/03/2016  . Bladder mass 11/10/2013    Palliative Care Assessment & Plan   Patient Profile: 76 y.o. male  admitted on 09/15/2016 witha past medical history of hypertension, hyperlipidemia, CKD renal mass.     Admitted to neurosurgery service for severe right-sided back pain radiating to the right thigh and knee, progressive for the past 5 months severely limiting his mobility. (He is now wheelchair bound). MRI of the spine showed pathologic L4 compression fracture. On 5/1 ,Underwent right L4-5 laminectomy and foraminotomy with decompression, had L4 vertebral body biopsy.  CT of the chest shows large right perihilar and infrahilar mass with mediastinal adenopathy and small right pleural effusion with postobstructive right lobe consolidation. Also has calcified pulmonary nodules on the right. Hypodense lesions in the liver also found.  CT of the head done given findings of pulmonary nodules and some confusion showed right posterior parietal lobe hemorrhagic metastases with some mass effect.  Plan was for [facility] followed by resection and hopefully ready himself for systemic treatment. Unfortunately post-op a hematoma filled the resection cavity with progressive effacement of the 4th ventricle.     Family made decision to shift to comfort and dignity path minimizing life prolonging intervetnions  Assessment:  Transitioning at EOL   Goals of Care and Additional Recommendations:  Limitations on Scope of Treatment: Full Comfort Care   Symptom management Morphine, Ativan, Robinol  Code Status:    Code Status Orders        Start     Ordered   09/29/16 1845  Do not attempt resuscitation (DNR)  Continuous    Question Answer Comment  In the event of cardiac or respiratory ARREST Do not call a "code blue"   In the event of cardiac or respiratory ARREST Do not perform Intubation, CPR, defibrillation or ACLS   In the event of cardiac or respiratory ARREST Use medication by any route, position,  wound care, and other measures to relive pain and suffering. May use oxygen, suction and manual treatment  of airway obstruction as needed for comfort.      09/29/16 1845    Code Status History    Date Active Date Inactive Code Status Order ID Comments User Context   09/15/2016  2:19 PM 09/29/2016  6:45 PM Full Code [id number]  [name], MD Inpatient    Advance Directive Documentation     Most Recent Value  Type of Advance Directive  Living will  Pre-existing out of facility DNR order (yellow form or pink MOST form)  -  "MOST" Form in Place?  -       Prognosis:   Hours - Days  Discharge Planning:  Anticipated Hospital Death  Care plan was discussed with Bedside nurisng  Thank you for allowing the Palliative Medicine Team to assist in the care of this patient.   Time In: 0800 Time Out: 0900 Total Time 60 min 60 min Prolonged Time Billed  no       Greater than 50%  of this time was spent counseling and coordinating care related to the above assessment and plan.  Wadie Lessen, NP  Please contact Palliative Medicine Team phone at [phone] for questions and concerns.

## 2016-09-30 NOTE — Care Management Note (Signed)
Case Management Note Original note by: Pollie Friar, RN 09/25/2016, 11:18 AM  Patient Details  Name: Zachary Mccarthy MRN: [id number] Date of Birth: Apr 27, 1941  Subjective/Objective:                    Action/Plan: Plan is for [facility] tomorrow and Craniotomy on Monday. CM following for d/c needs post surgery. Plan is for SNF. CSW aware.   Expected Discharge Date:                  Expected Discharge Plan:     In-House Referral:  Clinical Social Work, Clinical biochemist, Hospice / Palliative Care  Discharge planning Services  CM Consult  Post Acute Care Choice:    Choice offered to:     DME Arranged:    DME Agency:     HH Arranged:    HH Agency:     Status of Service:  In process, will continue to follow  If discussed at Long Length of Stay Meetings, dates discussed:    Additional Comments:  09/30/16 J. Malajah Oceguera, RN, BSN  Family has chosen to offer comfort measures for pt after being seen by palliative team.  Pt has been transitioned to full comfort measures; anticipate hospital death.    Reinaldo Raddle, RN, BSN  Trauma/Neuro ICU Case Manager [phone]

## 2016-10-01 ENCOUNTER — Encounter: Payer: Self-pay | Admitting: Radiation Oncology

## 2016-10-01 NOTE — Progress Notes (Signed)
  Radiation Oncology         [phone] ________________________________  Name: Zachary Mccarthy MRN: [id number]  Date: 10/01/2016  DOB: [birth year]  End of Treatment Note  Diagnosis:   Metastatic brain cancer   Indication for treatment:  Palliative      Radiation treatment dates:   09/26/16  Site/dose:   SRS S1 treated to 20 Gy in 1 fraction. SRS VMAT treated to 14 Gy in 1 fraction.    Beams/energy:   SBRT/SRT-3D  //  6X-FFF  Narrative: The patient tolerated radiation treatment relatively well. He was successfully transported as an inpatient from [facility] to [place] for treatment, and then transported back to [facility]. The patient was without complaint.  Plan: The patient has completed radiation treatment. The patient will return to radiation oncology clinic for routine followup in one month. I advised him to call or return sooner if he has any questions or concerns related to his recovery or treatment.  ------------------------------------------------  Jodelle Gross, MD, PhD   This document serves as a record of services personally performed by Tyler Pita, MD. It was created on his behalf by Maryla Morrow, a trained medical scribe. The creation of this record is based on the scribe's personal observations and the provider's statements to them. This document has been checked and approved by the attending provider.

## 2016-10-01 NOTE — Progress Notes (Signed)
No change in patient's condition. Remains unresponsive. Cont current palliative care.

## 2016-10-02 DIAGNOSIS — R627 Adult failure to thrive: Secondary | ICD-10-CM

## 2016-10-02 MED ORDER — WHITE PETROLATUM GEL
Status: AC
  Filled 2016-10-02: qty 1

## 2016-10-02 MED ORDER — ARTIFICIAL TEARS OPHTHALMIC OINT
TOPICAL_OINTMENT | Freq: Three times a day (TID) | OPHTHALMIC | Status: DC
  Administered 2016-10-02: 14:00:00 via OPHTHALMIC
  Filled 2016-10-02 (×2): qty 3.5

## 2016-10-02 NOTE — Progress Notes (Signed)
Report called to Zachary Mccarthy at [facility]

## 2016-10-02 NOTE — Progress Notes (Signed)
Called office x2 trying to find a Doc to come sign the DNR form for DC to [facility].

## 2016-10-02 NOTE — Progress Notes (Signed)
Daily Progress Note   Patient Name: Zachary Mccarthy       Date: 10/02/2016 DOB: Apr 06, 1941  Age: 76 y.o. MRN#: [id number] Attending Physician: [name], MD Primary Care Physician: [name], MD Admit Date: 09/15/2016  Reason for Consultation/Follow-up: Establishing goals of care and Psychosocial/spiritual support  Subjective:  -meet at bedside with Zachary Mccarthy.   We spoke to the fact that Zachary Mccarthy "is still with [place]".  Discussion regarding "in God's time and when he is ready" , he will pass.   -patient appears comfortable.  Discussion around disposition to a hospice facility.  Zachary Mccarthy is open to that and requests [facility]  - she understands his prognosis is limited,  her hope is for comfort and dignity.  I believe he is safe for transport although anything can happen at any time.  - discussed the Natural trajectory and expectations at EOL were discussed.  Questions and concerns addressed.   Family encouraged to call with questions or concerns.    - left message for Dr Kathyrn Sheriff  PMT will continue to support holistically.  Length of Stay: 17  Current Medications: Scheduled Meds:  . white petrolatum      . dexamethasone  4 mg Intravenous Q6H  . glycopyrrolate  0.4 mg Intravenous QID    Continuous Infusions: . sodium chloride 10 mL/hr at 09/30/16 0855    PRN Meds: bisacodyl, LORazepam, morphine injection, ondansetron **OR** ondansetron (ZOFRAN) IV, sodium phosphate  Physical Exam  Constitutional: He appears cachectic. He appears ill.  Eyes:  Bilateral Sclera/conjuctiva edema L>R  Cardiovascular: Bradycardia present.   Pulmonary/Chest: He has decreased breath sounds in the right lower field and the left lower field.  Neurological: He is  unresponsive.  Skin: Skin is warm and dry.            Vital Signs: BP 133/86 (BP Location: Left Arm)   Pulse (!) 58   Temp 98.9 F (37.2 C) (Oral)   Resp (!) 26   Ht '5\' 9"'$  (1.753 m)   Wt 70.3 kg (154 lb 15.7 oz)   SpO2 (!) 82%   BMI 22.89 kg/m  SpO2: SpO2: (!) 82 % O2 Device: O2 Device: Nasal Cannula O2 Flow Rate: O2 Flow Rate (L/min): 3 L/min  Intake/output summary:   Intake/Output Summary (Last 24 hours)  at 10/02/16 1122 Last data filed at 10/02/16 9509  Gross per 24 hour  Intake               80 ml  Output             2525 ml  Net            -2445 ml   LBM: Last BM Date: 09/28/16 Baseline Weight: Weight: 63.2 kg (139 lb 6.4 oz) Most recent weight: Weight: 70.3 kg (154 lb 15.7 oz)       Palliative Assessment/Data:  10%    Flowsheet Rows     Most Recent Value  Intake Tab  Referral Department  Neurology  Unit at Time of Referral  Med/Surg Unit  Palliative Care Primary Diagnosis  Cancer  Date Notified  09/23/16  Palliative Care Type  New Palliative care  Reason for referral  Clarify Goals of Care  Date of Admission  09/15/16  Date first seen by Palliative Care  09/23/16  # of days Palliative referral response time  0 Day(s)  # of days IP prior to Palliative referral  8  Clinical Assessment  Palliative Performance Scale Score  50%  Psychosocial & Spiritual Assessment  Palliative Care Outcomes      Patient Active Problem List   Diagnosis Date Noted  . Status post craniotomy   . Palliative care by specialist   . 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 Loss of weight   . Brain metastasis ([place]) 09/19/2016  . Lung nodule   . Pathologic compression fracture of spine ([place]) 09/15/2016  . Essential hypertension 09/15/2016  . Hyperlipidemia 09/15/2016  . Chronic kidney disease 09/15/2016  . DNR (do not resuscitate) discussion   . Confusion   . Pathologic compression fracture of lumbar vertebra ([place]) 09/03/2016  . Bladder mass 11/10/2013    Palliative  Care Assessment & Plan   Patient Profile: 76 y.o. male admitted on 09/15/2016 witha past medical history of hypertension, hyperlipidemia, CKD renal mass.     Admitted to neurosurgery service for severe right-sided back pain radiating to the right thigh and knee, progressive for the past 5 months severely limiting his mobility. (He is now wheelchair bound). MRI of the spine showed pathologic L4 compression fracture. On 5/1 ,Underwent right L4-5 laminectomy and foraminotomy with decompression, had L4 vertebral body biopsy.  CT of the chest shows large right perihilar and infrahilar mass with mediastinal adenopathy and small right pleural effusion with postobstructive right lobe consolidation. Also has calcified pulmonary nodules on the right. Hypodense lesions in the liver also found.  CT of the head done given findings of pulmonary nodules and some confusion showed right posterior parietal lobe hemorrhagic metastases with some mass effect.  Plan was for [facility] followed by resection and hopefully ready himself for systemic treatment. Unfortunately post-op a hematoma filled the resection cavity with progressive effacement of the 4th ventricle.     Family made decision to shift to comfort and dignity path minimizing life prolonging intervetnions  Assessment:  Transitioning at EOL   Goals of Care and Additional Recommendations:  Limitations on Scope of Treatment: Full Comfort Care   Symptom management Morphine, Ativan, Rubinol, Artifical tears ointment  Code Status:    Code Status Orders        Start     Ordered   09/29/16 1845  Do not attempt resuscitation (DNR)  Continuous    Question Answer Comment  In the event of cardiac or respiratory ARREST  Do not call a "code blue"   In the event of cardiac or respiratory ARREST Do not perform Intubation, CPR, defibrillation or ACLS   In the event of cardiac or respiratory ARREST Use medication by any route, position, wound care, and other  measures to relive pain and suffering. May use oxygen, suction and manual treatment of airway obstruction as needed for comfort.      09/29/16 1845    Code Status History    Date Active Date Inactive Code Status Order ID Comments User Context   09/15/2016  2:19 PM 09/29/2016  6:45 PM Full Code [id number]  [name], MD Inpatient    Advance Directive Documentation     Most Recent Value  Type of Advance Directive  Living will  Pre-existing out of facility DNR order (yellow form or pink MOST form)  -  "MOST" Form in Place?  -       Prognosis:   Hours - Days  Discharge Planning:  Possible discharge to hospice facility, will check for bed availability   Care plan was discussed with nursing, SW  Thank you for allowing the Palliative Medicine Team to assist in the care of this patient.   Time In: 1030 Time Out: 1105 Total Time 60 min 35 min Prolonged Time Billed  no       Greater than 50%  of this time was spent counseling and coordinating care related to the above assessment and plan.  Wadie Lessen, NP  Please contact Palliative Medicine Team phone at [phone] for questions and concerns.

## 2016-10-02 NOTE — Progress Notes (Signed)
Received call from [facility], NP.   Pt is going to be transferred to Hospice once bed is available Discharge orders in place/signed should it happen today

## 2016-10-02 NOTE — Progress Notes (Signed)
Vencent Costella PA called at this time and stated that Dr. Kathyrn Sheriff would be here between 6-7 pm..

## 2016-10-02 NOTE — Progress Notes (Signed)
Pts condition unchanged, with slight decrease in SpO2. I again spoke with the patient's wife who is grateful for all those involved in Zachary Mccarthy care, and his treatment with dignity and respect. He may go to hospice today.

## 2016-10-02 NOTE — Care Management Important Message (Signed)
Important Message  Patient Details  Name: Zachary Mccarthy MRN: [id number] Date of Birth: December 04, 1940   Medicare Important Message Given:  Other (see comment) Patient wife said that they no longer needed another copy    Mantaj Chamberlin 10/02/2016, 8:14 AM

## 2016-10-02 NOTE — Consult Note (Signed)
[name]: Met in room with pt and family. He is unresponsive and appears to be comfortable. Discussion held with pt's family about hospice care, the goals of the pt and Hospice home criteria. They are in agreement with the pt going to the hospice home today. My medical director has approved the pt and MSW will set up transport. Thank you for this referral and the opportunity to serve our community. Webb Silversmith RN [phone]

## 2016-10-02 NOTE — Discharge Summary (Addendum)
  Physician Discharge Summary  Patient ID: Zachary Mccarthy MRN: [id number] DOB/AGE: 78-30-1942 78 y.o.  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 male who was admitted for the below procedure. Unfortunately, after surgery listed below, patient's neuro exam was drastically changed: unable to cross midline to right, drowsy, following minimal commands, slurred speech, weakness of extremities. A repeat CT head was ordered which showed blood in the post resective space approx 5.4cm. After long discussion with family about next steps, based on his likely untreatable stage IV lung cancer, they wished to undergo palliative care only and declined further surgical intervention. Palliative care was consulted, appreciative of their assistance. Pt was discharged to hospice to continue palliative care. Discharge meds per palliative care.  Treatments: Surgery 1. Stereotactic suboccipital craniectomy for resection of intra-axial metastatic lesion 2. Use of intraoperative microscope for microdissection  Discharge Exam: Blood pressure 133/86, pulse (!) 58, temperature 98.9 F (37.2 C), temperature source Oral, resp. rate (!) 26, height '5\' 9"'$  (1.753 m), weight 70.3 kg (154 lb 15.7 oz), SpO2 (!) 82 %.  Allergies as of 10/02/2016      Reactions   No Known Allergies       Medication List    STOP taking these medications   acetaminophen 500 MG tablet Commonly known as:  TYLENOL   amLODipine 10 MG tablet Commonly  known as:  NORVASC   carvedilol 25 MG tablet Commonly known as:  COREG   clopidogrel 75 MG tablet Commonly known as:  PLAVIX   ezetimibe-simvastatin 10-20 MG tablet Commonly known as:  VYTORIN        SignedCato Mulligan, Florence J 10/02/2016, 12:51 PM

## 2016-10-02 NOTE — Progress Notes (Signed)
Zachary Mccarthy returned call and stated a doctor would be here to sign the DNR form

## 2016-10-15 ENCOUNTER — Encounter: Payer: Self-pay | Admitting: Hematology & Oncology

## 2016-10-17 ENCOUNTER — Encounter (HOSPITAL_COMMUNITY): Payer: Self-pay

## 2016-10-17 NOTE — Addendum Note (Signed)
Addendum  created 10/17/16 1306 by Effie Berkshire, MD   Sign clinical note

## 2016-10-17 DEATH — deceased

## 2016-10-20 ENCOUNTER — Encounter (HOSPITAL_COMMUNITY): Payer: Self-pay

## 2017-01-12 ENCOUNTER — Encounter: Payer: Self-pay | Admitting: Radiation Therapy

## 2018-12-22 IMAGING — CT CT HEAD W/O CM
3 series · 15 of 47 positions shown, 18 images · non-contrast
Comparison: MRI 09/22/2016.  CT 09/16/2016.

CLINICAL DATA: Followup craniotomy.

EXAM:
CT HEAD WITHOUT CONTRAST
TECHNIQUE: Contiguous axial images were obtained from the base of the skull
through the vertex without intravenous contrast.

[Series 3: head 5.0 h30s · axial · 0.43mm/px · z∈[-98,+32]mm · 9 of 32 slices shown, 12 images]
[im 3/32  brain]
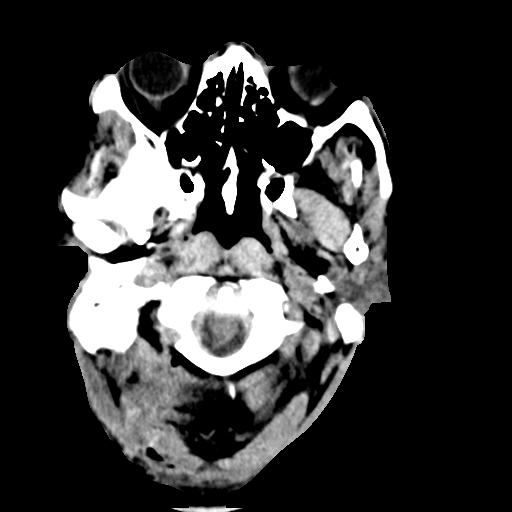
[im 3/32  bone]
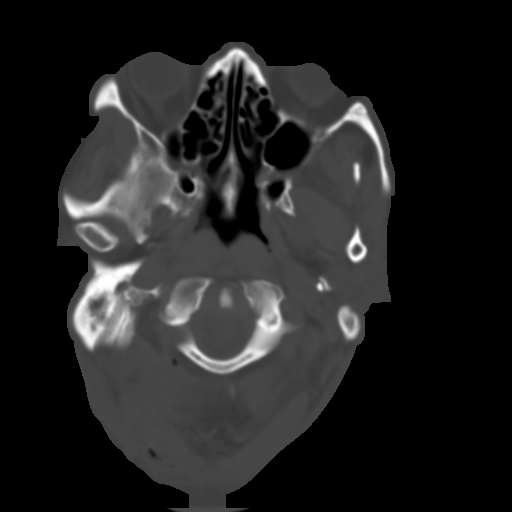
[im 6/32  brain]
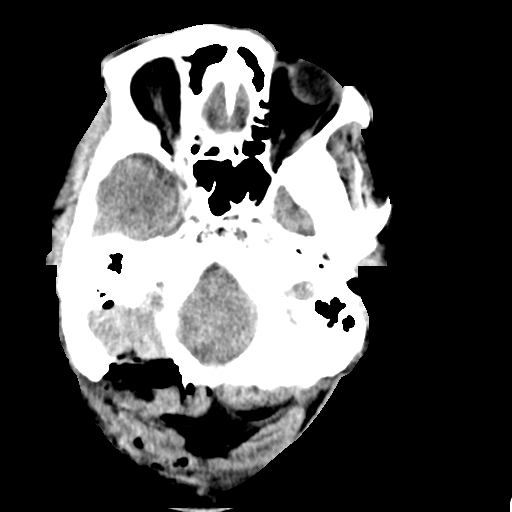
[im 9/32  brain]
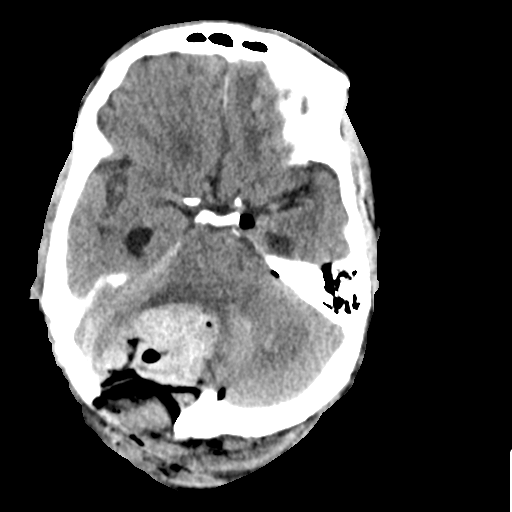
[im 12/32  brain]
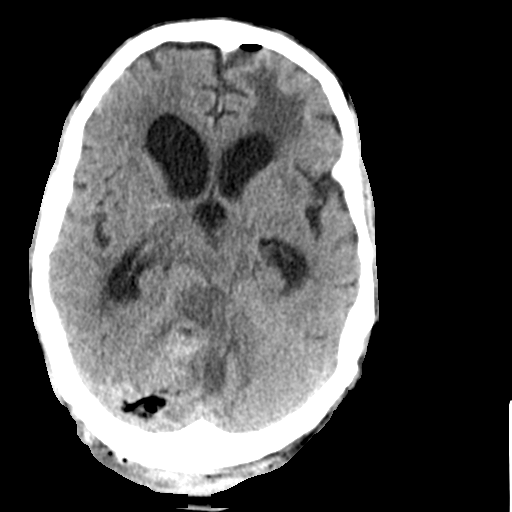
[im 17/32  brain]
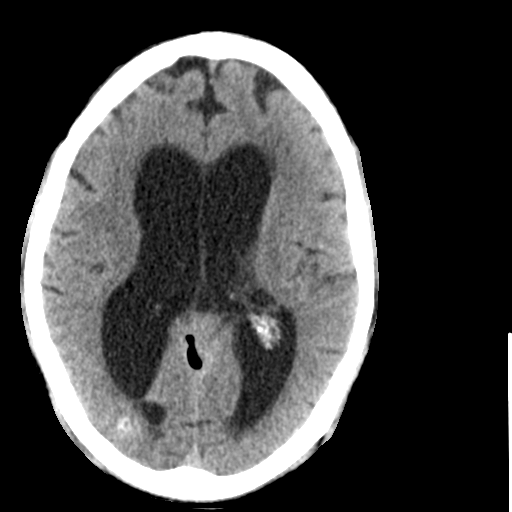
[im 17/32  bone]
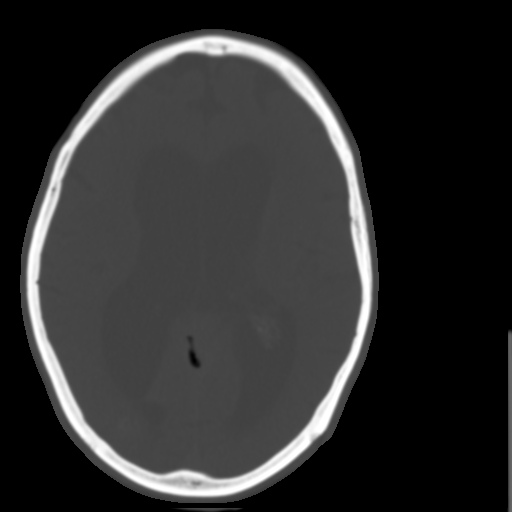
[im 20/32  brain]
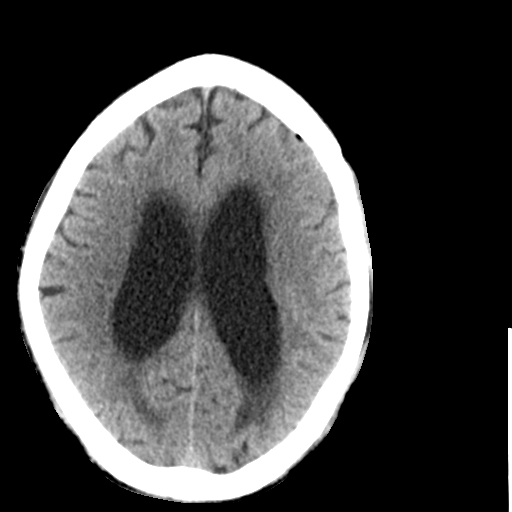
[im 23/32  brain]
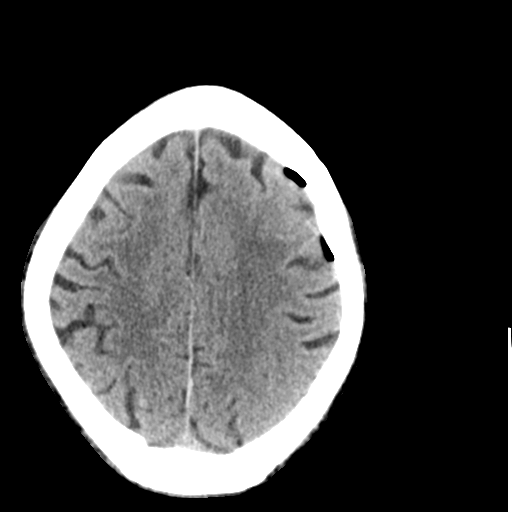
[im 26/32  brain]
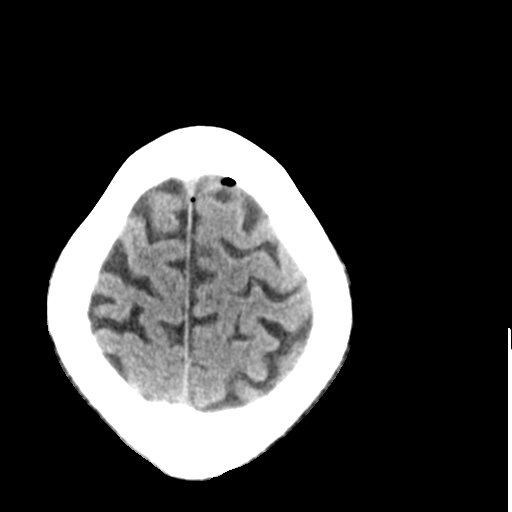
[im 29/32  brain]
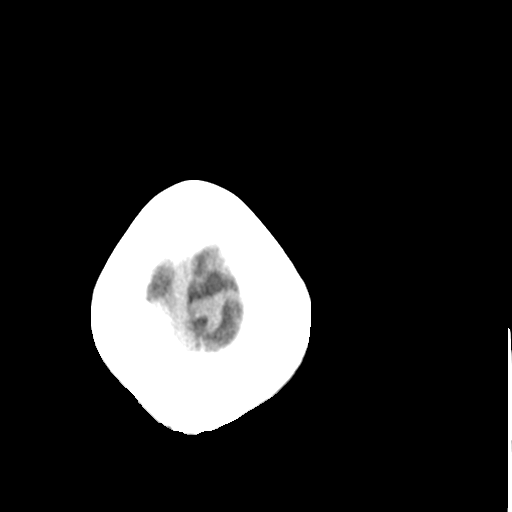
[im 29/32  bone]
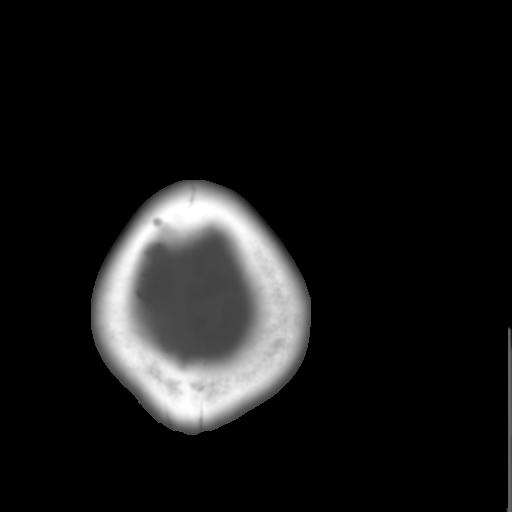

[Series 5: head 3.0 mpr cor · coronal · 0.33mm/px · 3 of 70 slices shown]
[im 24/70  brain]
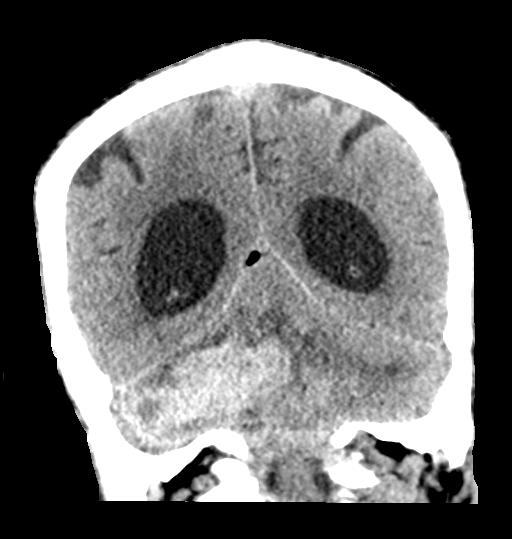
[im 31/70  brain]
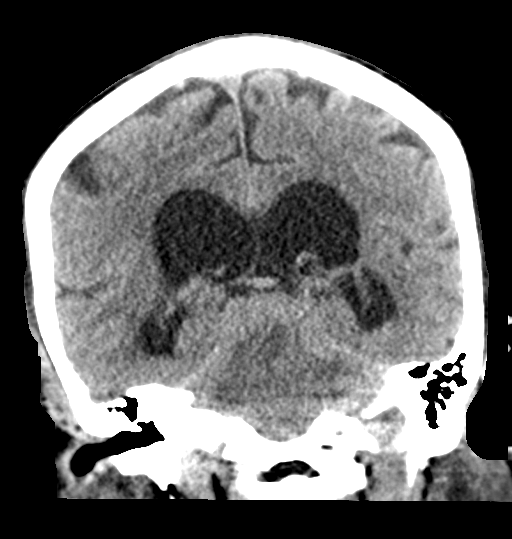
[im 39/70  brain]
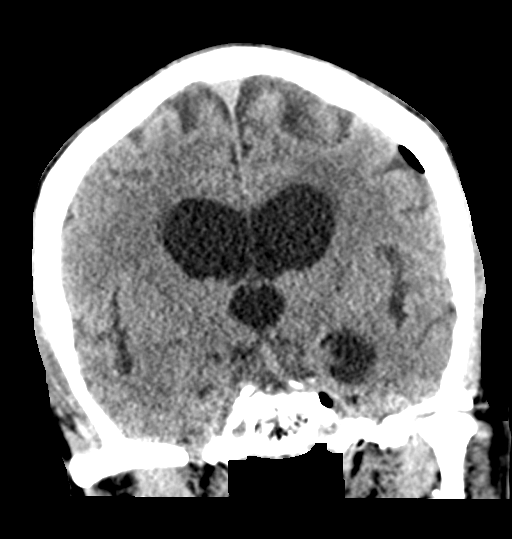

[Series 6: head 3.0 mpr sag · sagittal · 0.33mm/px · 3 of 66 slices shown]
[im 22/66  brain]
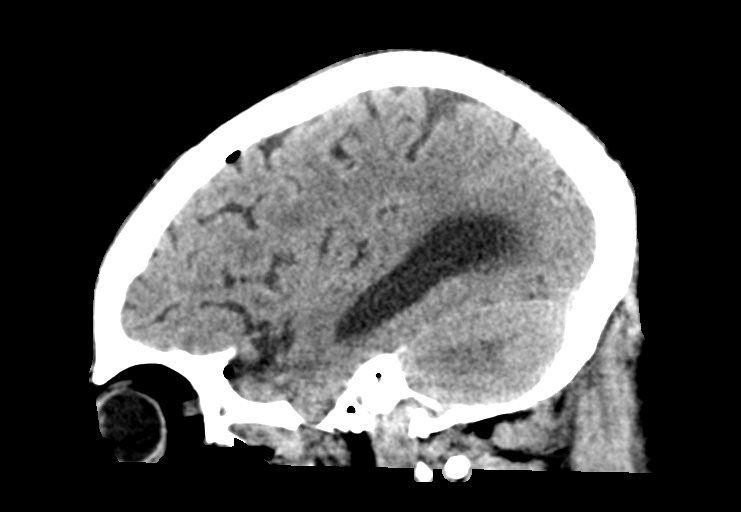
[im 33/66  brain]
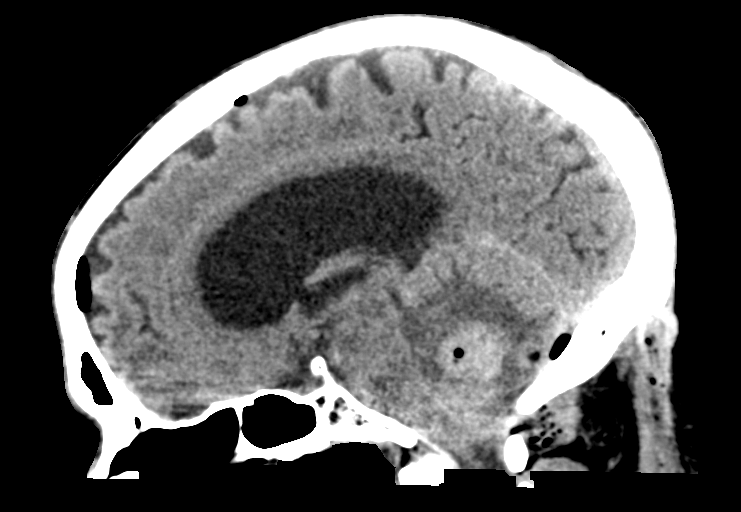
[im 44/66  brain]
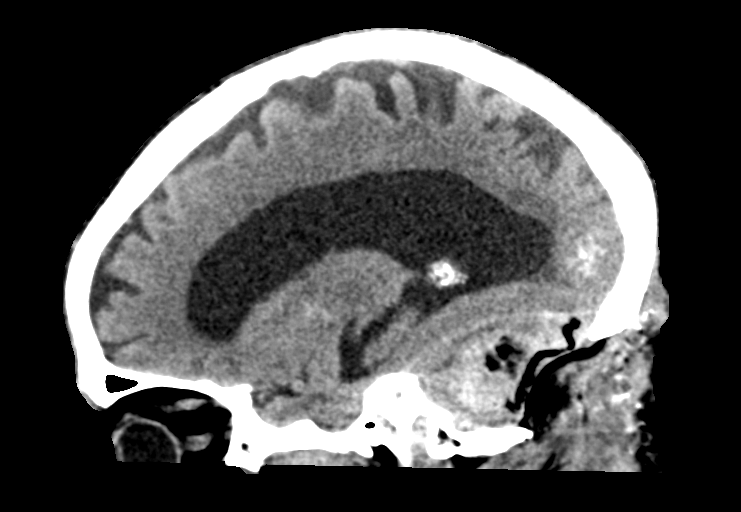

[15 of 47 positions shown; findings below may reference images not displayed]

FINDINGS: Interval right occipital craniectomy for resection of right
cerebellar masses. There is blood in the post resective space,
measuring approximately 5.4 cm in diameter. There is extensive edema
within the cerebellum. Fourth ventricle can't be identified.
Continued dilatation of the lateral and third ventricles, probably a
degree of hydrocephalus related to the fourth ventricular
compromise. Partially calcified metastasis in the right occipital
lobe again demonstrable. Other lesions not well seen. Vasogenic
edema in the left frontal lobe appears the same. Small amount of
subarachnoid air scattered about, not significant.
IMPRESSION: Interval right occipital craniectomy and debulking of right
cerebellar masses. Blood in the post resective space measuring
approximately 5.4 cm in diameter. Cerebellar edema and swelling.
Continued compromise of the fourth ventricle with continued
hydrocephalus of the lateral and third ventricles.
